# Patient Record
Sex: Female | Born: 1965
Health system: Southern US, Community
[De-identification: ages and names within clinical notes are randomized; demographics above are authoritative.]

## PROBLEM LIST (undated history)

## (undated) DIAGNOSIS — R05 Cough: Secondary | ICD-10-CM

## (undated) DIAGNOSIS — K219 Gastro-esophageal reflux disease without esophagitis: Secondary | ICD-10-CM

## (undated) DIAGNOSIS — E785 Hyperlipidemia, unspecified: Secondary | ICD-10-CM

## (undated) DIAGNOSIS — R059 Cough, unspecified: Secondary | ICD-10-CM

## (undated) DIAGNOSIS — R011 Cardiac murmur, unspecified: Secondary | ICD-10-CM

## (undated) HISTORY — DX: Gastro-esophageal reflux disease without esophagitis: K21.9

## (undated) HISTORY — DX: Cough: R05

## (undated) HISTORY — DX: Hyperlipidemia, unspecified: E78.5

## (undated) HISTORY — DX: Cough, unspecified: R05.9

## (undated) HISTORY — PX: NASAL SINUS SURGERY: SHX719

## (undated) HISTORY — DX: Cardiac murmur, unspecified: R01.1

---

## 2001-05-14 HISTORY — PX: BREAST LUMPECTOMY: SHX2

## 2004-07-07 ENCOUNTER — Ambulatory Visit (HOSPITAL_COMMUNITY): Admission: RE | Admit: 2004-07-07 | Discharge: 2004-07-07 | Payer: Self-pay | Admitting: Family Medicine

## 2004-07-18 ENCOUNTER — Encounter: Admission: RE | Admit: 2004-07-18 | Discharge: 2004-07-18 | Payer: Self-pay | Admitting: Family Medicine

## 2004-09-21 ENCOUNTER — Encounter: Admission: RE | Admit: 2004-09-21 | Discharge: 2004-09-21 | Payer: Self-pay | Admitting: Family Medicine

## 2005-06-08 ENCOUNTER — Ambulatory Visit (HOSPITAL_COMMUNITY): Admission: RE | Admit: 2005-06-08 | Discharge: 2005-06-08 | Payer: Self-pay | Admitting: Family Medicine

## 2005-06-08 ENCOUNTER — Encounter: Admission: RE | Admit: 2005-06-08 | Discharge: 2005-06-08 | Payer: Self-pay | Admitting: Family Medicine

## 2005-06-22 ENCOUNTER — Ambulatory Visit: Payer: Self-pay | Admitting: Internal Medicine

## 2005-07-03 ENCOUNTER — Ambulatory Visit: Payer: Self-pay | Admitting: Cardiovascular Disease

## 2006-06-11 ENCOUNTER — Encounter: Admission: RE | Admit: 2006-06-11 | Discharge: 2006-06-11 | Payer: Self-pay | Admitting: Family Medicine

## 2008-02-06 ENCOUNTER — Encounter: Admission: RE | Admit: 2008-02-06 | Discharge: 2008-02-06 | Payer: Self-pay | Admitting: Family Medicine

## 2008-02-10 ENCOUNTER — Encounter: Admission: RE | Admit: 2008-02-10 | Discharge: 2008-02-10 | Payer: Self-pay | Admitting: Family Medicine

## 2008-11-08 ENCOUNTER — Ambulatory Visit (HOSPITAL_COMMUNITY): Admission: RE | Admit: 2008-11-08 | Discharge: 2008-11-08 | Payer: Self-pay | Admitting: Specialist

## 2008-11-22 ENCOUNTER — Inpatient Hospital Stay (HOSPITAL_COMMUNITY): Admission: AD | Admit: 2008-11-22 | Discharge: 2008-11-22 | Payer: Self-pay | Admitting: Obstetrics & Gynecology

## 2009-01-08 ENCOUNTER — Inpatient Hospital Stay (HOSPITAL_COMMUNITY): Admission: AD | Admit: 2009-01-08 | Discharge: 2009-01-08 | Payer: Self-pay | Admitting: Obstetrics and Gynecology

## 2009-02-03 ENCOUNTER — Inpatient Hospital Stay (HOSPITAL_COMMUNITY): Admission: AD | Admit: 2009-02-03 | Discharge: 2009-03-26 | Payer: Self-pay | Admitting: Obstetrics and Gynecology

## 2009-02-07 ENCOUNTER — Encounter: Payer: Self-pay | Admitting: Obstetrics and Gynecology

## 2009-02-15 ENCOUNTER — Encounter: Payer: Self-pay | Admitting: Obstetrics and Gynecology

## 2009-03-14 ENCOUNTER — Encounter: Payer: Self-pay | Admitting: Obstetrics and Gynecology

## 2009-03-21 ENCOUNTER — Other Ambulatory Visit: Payer: Self-pay | Admitting: Obstetrics and Gynecology

## 2009-03-23 ENCOUNTER — Encounter (INDEPENDENT_AMBULATORY_CARE_PROVIDER_SITE_OTHER): Payer: Self-pay | Admitting: Obstetrics and Gynecology

## 2009-03-27 ENCOUNTER — Encounter: Admission: RE | Admit: 2009-03-27 | Discharge: 2009-04-25 | Payer: Self-pay | Admitting: Obstetrics and Gynecology

## 2009-04-26 ENCOUNTER — Encounter: Admission: RE | Admit: 2009-04-26 | Discharge: 2009-05-12 | Payer: Self-pay | Admitting: Obstetrics and Gynecology

## 2009-05-27 ENCOUNTER — Encounter: Admission: RE | Admit: 2009-05-27 | Discharge: 2009-06-26 | Payer: Self-pay | Admitting: Obstetrics and Gynecology

## 2009-06-27 ENCOUNTER — Encounter: Admission: RE | Admit: 2009-06-27 | Discharge: 2009-07-27 | Payer: Self-pay | Admitting: Obstetrics and Gynecology

## 2009-08-25 ENCOUNTER — Encounter: Admission: RE | Admit: 2009-08-25 | Discharge: 2009-09-24 | Payer: Self-pay | Admitting: Obstetrics and Gynecology

## 2009-09-17 ENCOUNTER — Emergency Department (HOSPITAL_COMMUNITY): Admission: EM | Admit: 2009-09-17 | Discharge: 2009-09-17 | Payer: Self-pay | Admitting: Emergency Medicine

## 2009-09-25 ENCOUNTER — Encounter: Admission: RE | Admit: 2009-09-25 | Discharge: 2009-10-25 | Payer: Self-pay | Admitting: Obstetrics and Gynecology

## 2009-09-25 ENCOUNTER — Ambulatory Visit (HOSPITAL_COMMUNITY): Admission: RE | Admit: 2009-09-25 | Discharge: 2009-09-25 | Payer: Self-pay | Admitting: Family Medicine

## 2009-10-26 ENCOUNTER — Encounter: Admission: RE | Admit: 2009-10-26 | Discharge: 2009-11-25 | Payer: Self-pay | Admitting: Obstetrics and Gynecology

## 2009-11-26 ENCOUNTER — Encounter: Admission: RE | Admit: 2009-11-26 | Discharge: 2009-12-26 | Payer: Self-pay | Admitting: Obstetrics and Gynecology

## 2009-12-27 ENCOUNTER — Encounter: Admission: RE | Admit: 2009-12-27 | Discharge: 2010-01-18 | Payer: Self-pay | Admitting: Obstetrics and Gynecology

## 2010-01-23 ENCOUNTER — Emergency Department (HOSPITAL_COMMUNITY): Admission: EM | Admit: 2010-01-23 | Discharge: 2010-01-23 | Payer: Self-pay | Admitting: Emergency Medicine

## 2010-06-04 ENCOUNTER — Encounter: Payer: Self-pay | Admitting: Family Medicine

## 2010-06-05 ENCOUNTER — Encounter: Payer: Self-pay | Admitting: Obstetrics and Gynecology

## 2010-06-05 ENCOUNTER — Encounter: Payer: Self-pay | Admitting: Family Medicine

## 2010-08-16 LAB — URINALYSIS, ROUTINE W REFLEX MICROSCOPIC
Glucose, UA: NEGATIVE mg/dL
pH: 7 (ref 5.0–8.0)

## 2010-08-16 LAB — CBC
HCT: 33.2 % — ABNORMAL LOW (ref 36.0–46.0)
Hemoglobin: 11.1 g/dL — ABNORMAL LOW (ref 12.0–15.0)
MCV: 90.1 fL (ref 78.0–100.0)
Platelets: 238 10*3/uL (ref 150–400)
Platelets: 254 10*3/uL (ref 150–400)
RBC: 3.74 MIL/uL — ABNORMAL LOW (ref 3.87–5.11)
RDW: 15.7 % — ABNORMAL HIGH (ref 11.5–15.5)
RDW: 15.9 % — ABNORMAL HIGH (ref 11.5–15.5)
WBC: 11.8 10*3/uL — ABNORMAL HIGH (ref 4.0–10.5)
WBC: 12.9 10*3/uL — ABNORMAL HIGH (ref 4.0–10.5)

## 2010-08-16 LAB — GLUCOSE, CAPILLARY
Glucose-Capillary: 104 mg/dL — ABNORMAL HIGH (ref 70–99)
Glucose-Capillary: 109 mg/dL — ABNORMAL HIGH (ref 70–99)
Glucose-Capillary: 110 mg/dL — ABNORMAL HIGH (ref 70–99)
Glucose-Capillary: 111 mg/dL — ABNORMAL HIGH (ref 70–99)
Glucose-Capillary: 115 mg/dL — ABNORMAL HIGH (ref 70–99)
Glucose-Capillary: 131 mg/dL — ABNORMAL HIGH (ref 70–99)
Glucose-Capillary: 131 mg/dL — ABNORMAL HIGH (ref 70–99)
Glucose-Capillary: 133 mg/dL — ABNORMAL HIGH (ref 70–99)
Glucose-Capillary: 169 mg/dL — ABNORMAL HIGH (ref 70–99)
Glucose-Capillary: 69 mg/dL — ABNORMAL LOW (ref 70–99)
Glucose-Capillary: 75 mg/dL (ref 70–99)
Glucose-Capillary: 83 mg/dL (ref 70–99)
Glucose-Capillary: 86 mg/dL (ref 70–99)
Glucose-Capillary: 87 mg/dL (ref 70–99)
Glucose-Capillary: 92 mg/dL (ref 70–99)
Glucose-Capillary: 95 mg/dL (ref 70–99)
Glucose-Capillary: 96 mg/dL (ref 70–99)
Glucose-Capillary: 99 mg/dL (ref 70–99)

## 2010-08-16 LAB — URINE MICROSCOPIC-ADD ON

## 2010-08-16 LAB — MAGNESIUM
Magnesium: 5.1 mg/dL — ABNORMAL HIGH (ref 1.5–2.5)
Magnesium: 6.1 mg/dL (ref 1.5–2.5)

## 2010-08-16 LAB — URINE CULTURE
Colony Count: 75000
Culture: NO GROWTH

## 2010-08-16 LAB — STREP B DNA PROBE: Strep Group B Ag: NEGATIVE

## 2010-08-17 LAB — GLUCOSE, CAPILLARY
Glucose-Capillary: 100 mg/dL — ABNORMAL HIGH (ref 70–99)
Glucose-Capillary: 101 mg/dL — ABNORMAL HIGH (ref 70–99)
Glucose-Capillary: 101 mg/dL — ABNORMAL HIGH (ref 70–99)
Glucose-Capillary: 101 mg/dL — ABNORMAL HIGH (ref 70–99)
Glucose-Capillary: 101 mg/dL — ABNORMAL HIGH (ref 70–99)
Glucose-Capillary: 107 mg/dL — ABNORMAL HIGH (ref 70–99)
Glucose-Capillary: 109 mg/dL — ABNORMAL HIGH (ref 70–99)
Glucose-Capillary: 111 mg/dL — ABNORMAL HIGH (ref 70–99)
Glucose-Capillary: 111 mg/dL — ABNORMAL HIGH (ref 70–99)
Glucose-Capillary: 116 mg/dL — ABNORMAL HIGH (ref 70–99)
Glucose-Capillary: 120 mg/dL — ABNORMAL HIGH (ref 70–99)
Glucose-Capillary: 120 mg/dL — ABNORMAL HIGH (ref 70–99)
Glucose-Capillary: 122 mg/dL — ABNORMAL HIGH (ref 70–99)
Glucose-Capillary: 123 mg/dL — ABNORMAL HIGH (ref 70–99)
Glucose-Capillary: 124 mg/dL — ABNORMAL HIGH (ref 70–99)
Glucose-Capillary: 138 mg/dL — ABNORMAL HIGH (ref 70–99)
Glucose-Capillary: 145 mg/dL — ABNORMAL HIGH (ref 70–99)
Glucose-Capillary: 62 mg/dL — ABNORMAL LOW (ref 70–99)
Glucose-Capillary: 82 mg/dL (ref 70–99)
Glucose-Capillary: 83 mg/dL (ref 70–99)
Glucose-Capillary: 87 mg/dL (ref 70–99)
Glucose-Capillary: 88 mg/dL (ref 70–99)
Glucose-Capillary: 88 mg/dL (ref 70–99)
Glucose-Capillary: 89 mg/dL (ref 70–99)
Glucose-Capillary: 89 mg/dL (ref 70–99)
Glucose-Capillary: 90 mg/dL (ref 70–99)
Glucose-Capillary: 91 mg/dL (ref 70–99)
Glucose-Capillary: 92 mg/dL (ref 70–99)
Glucose-Capillary: 93 mg/dL (ref 70–99)
Glucose-Capillary: 94 mg/dL (ref 70–99)
Glucose-Capillary: 94 mg/dL (ref 70–99)
Glucose-Capillary: 99 mg/dL (ref 70–99)
Glucose-Capillary: 99 mg/dL (ref 70–99)
Glucose-Capillary: 100 mg/dL — ABNORMAL HIGH (ref 70–99)
Glucose-Capillary: 101 mg/dL — ABNORMAL HIGH (ref 70–99)
Glucose-Capillary: 101 mg/dL — ABNORMAL HIGH (ref 70–99)
Glucose-Capillary: 101 mg/dL — ABNORMAL HIGH (ref 70–99)
Glucose-Capillary: 102 mg/dL — ABNORMAL HIGH (ref 70–99)
Glucose-Capillary: 103 mg/dL — ABNORMAL HIGH (ref 70–99)
Glucose-Capillary: 103 mg/dL — ABNORMAL HIGH (ref 70–99)
Glucose-Capillary: 104 mg/dL — ABNORMAL HIGH (ref 70–99)
Glucose-Capillary: 104 mg/dL — ABNORMAL HIGH (ref 70–99)
Glucose-Capillary: 108 mg/dL — ABNORMAL HIGH (ref 70–99)
Glucose-Capillary: 108 mg/dL — ABNORMAL HIGH (ref 70–99)
Glucose-Capillary: 108 mg/dL — ABNORMAL HIGH (ref 70–99)
Glucose-Capillary: 111 mg/dL — ABNORMAL HIGH (ref 70–99)
Glucose-Capillary: 111 mg/dL — ABNORMAL HIGH (ref 70–99)
Glucose-Capillary: 111 mg/dL — ABNORMAL HIGH (ref 70–99)
Glucose-Capillary: 118 mg/dL — ABNORMAL HIGH (ref 70–99)
Glucose-Capillary: 121 mg/dL — ABNORMAL HIGH (ref 70–99)
Glucose-Capillary: 124 mg/dL — ABNORMAL HIGH (ref 70–99)
Glucose-Capillary: 126 mg/dL — ABNORMAL HIGH (ref 70–99)
Glucose-Capillary: 77 mg/dL (ref 70–99)
Glucose-Capillary: 81 mg/dL (ref 70–99)
Glucose-Capillary: 83 mg/dL (ref 70–99)
Glucose-Capillary: 87 mg/dL (ref 70–99)
Glucose-Capillary: 89 mg/dL (ref 70–99)
Glucose-Capillary: 90 mg/dL (ref 70–99)
Glucose-Capillary: 93 mg/dL (ref 70–99)
Glucose-Capillary: 93 mg/dL (ref 70–99)
Glucose-Capillary: 95 mg/dL (ref 70–99)
Glucose-Capillary: 96 mg/dL (ref 70–99)
Glucose-Capillary: 97 mg/dL (ref 70–99)
Glucose-Capillary: 98 mg/dL (ref 70–99)
Glucose-Capillary: 98 mg/dL (ref 70–99)
Glucose-Capillary: 98 mg/dL (ref 70–99)
Glucose-Capillary: 99 mg/dL (ref 70–99)

## 2010-08-17 LAB — URINE CULTURE
Colony Count: 10000
Special Requests: NEGATIVE

## 2010-08-17 LAB — URINALYSIS, ROUTINE W REFLEX MICROSCOPIC
Bilirubin Urine: NEGATIVE
Glucose, UA: NEGATIVE mg/dL
Hgb urine dipstick: NEGATIVE
Protein, ur: NEGATIVE mg/dL
Specific Gravity, Urine: 1.015 (ref 1.005–1.030)
Urobilinogen, UA: 0.2 mg/dL (ref 0.0–1.0)

## 2010-08-17 LAB — URINALYSIS, MICROSCOPIC ONLY
Bilirubin Urine: NEGATIVE
Hgb urine dipstick: NEGATIVE
Ketones, ur: NEGATIVE mg/dL
Specific Gravity, Urine: >1.03 — ABNORMAL HIGH (ref 1.005–1.030)
pH: 6 (ref 5.0–8.0)

## 2010-08-18 LAB — CBC
HCT: 32.4 % — ABNORMAL LOW (ref 36.0–46.0)
MCV: 86.6 fL (ref 78.0–100.0)
Platelets: 266 10*3/uL (ref 150–400)
RDW: 14.6 % (ref 11.5–15.5)

## 2010-08-18 LAB — GLUCOSE, CAPILLARY
Glucose-Capillary: 101 mg/dL — ABNORMAL HIGH (ref 70–99)
Glucose-Capillary: 105 mg/dL — ABNORMAL HIGH (ref 70–99)
Glucose-Capillary: 106 mg/dL — ABNORMAL HIGH (ref 70–99)
Glucose-Capillary: 107 mg/dL — ABNORMAL HIGH (ref 70–99)
Glucose-Capillary: 109 mg/dL — ABNORMAL HIGH (ref 70–99)
Glucose-Capillary: 143 mg/dL — ABNORMAL HIGH (ref 70–99)
Glucose-Capillary: 78 mg/dL (ref 70–99)
Glucose-Capillary: 80 mg/dL (ref 70–99)
Glucose-Capillary: 81 mg/dL (ref 70–99)
Glucose-Capillary: 86 mg/dL (ref 70–99)
Glucose-Capillary: 88 mg/dL (ref 70–99)
Glucose-Capillary: 91 mg/dL (ref 70–99)
Glucose-Capillary: 93 mg/dL (ref 70–99)
Glucose-Capillary: 95 mg/dL (ref 70–99)
Glucose-Capillary: 97 mg/dL (ref 70–99)
Glucose-Capillary: 99 mg/dL (ref 70–99)

## 2010-08-18 LAB — URINE CULTURE

## 2010-08-18 LAB — DIFFERENTIAL
Basophils Absolute: 0.1 10*3/uL (ref 0.0–0.1)
Eosinophils Absolute: 0.1 10*3/uL (ref 0.0–0.7)
Eosinophils Relative: 1 % (ref 0–5)
Lymphocytes Relative: 20 % (ref 12–46)
Neutrophils Relative %: 69 % (ref 43–77)

## 2010-08-18 LAB — URINALYSIS, ROUTINE W REFLEX MICROSCOPIC
Ketones, ur: NEGATIVE mg/dL
Nitrite: NEGATIVE
Protein, ur: NEGATIVE mg/dL

## 2010-08-19 LAB — ANTIPHOSPHOLIPID SYNDROME EVAL, BLD
Anticardiolipin IgA: <10 APL U/mL — ABNORMAL LOW (ref <10)
Anticardiolipin IgG: <10 GPL U/mL — ABNORMAL LOW (ref <10)
Anticardiolipin IgM: <10 MPL U/mL — ABNORMAL LOW (ref <10)
Antiphosphatidylserine IgA: <20 APS U/mL (ref <20.0)
Antiphosphatidylserine IgG: <11 GPS U/mL (ref <11.0)
Antiphosphatidylserine IgM: <25 MPS U/mL (ref <25.0)
PTT Lupus Anticoagulant: 39.3 secs (ref 32.0–43.4)

## 2010-12-01 ENCOUNTER — Inpatient Hospital Stay (INDEPENDENT_AMBULATORY_CARE_PROVIDER_SITE_OTHER)
Admission: RE | Admit: 2010-12-01 | Discharge: 2010-12-01 | Disposition: A | Discharge status: Home / Self Care | Payer: 59 | Source: Ambulatory Visit | Attending: Emergency Medicine | Admitting: Emergency Medicine

## 2010-12-01 DIAGNOSIS — S61209A Unspecified open wound of unspecified finger without damage to nail, initial encounter: Secondary | ICD-10-CM

## 2010-12-13 IMAGING — US US OB TRANSVAGINAL
1 series · 12 of 12 positions shown · non-contrast
Comparison: none

OBSTETRICAL ULTRASOUND:
 This ultrasound exam was performed in the [HOSPITAL] Ultrasound Department.  The OB US report was generated in the AS system, and faxed to the ordering physician.  This report is also available in [HOSPITAL]?s accessANYware and in [REDACTED] PACS.

[Series 1: us ob transvaginal · 0.12mm/px · 12 acquisitions, 12 frames shown]
[im 1/12]
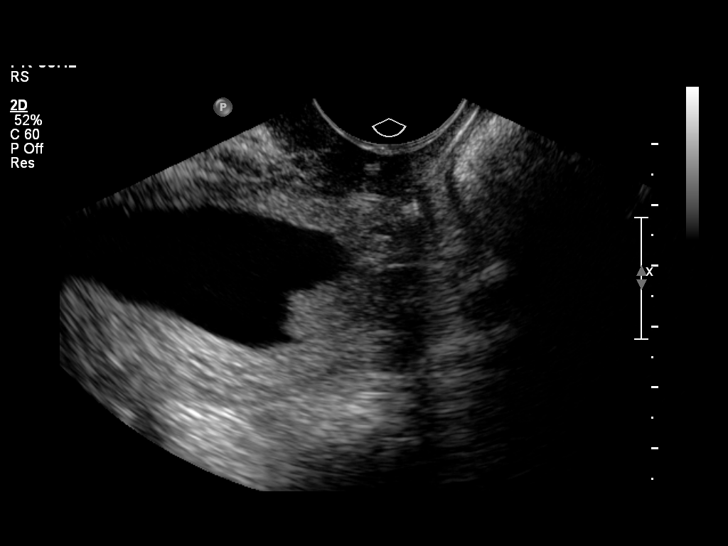
[im 2/12]
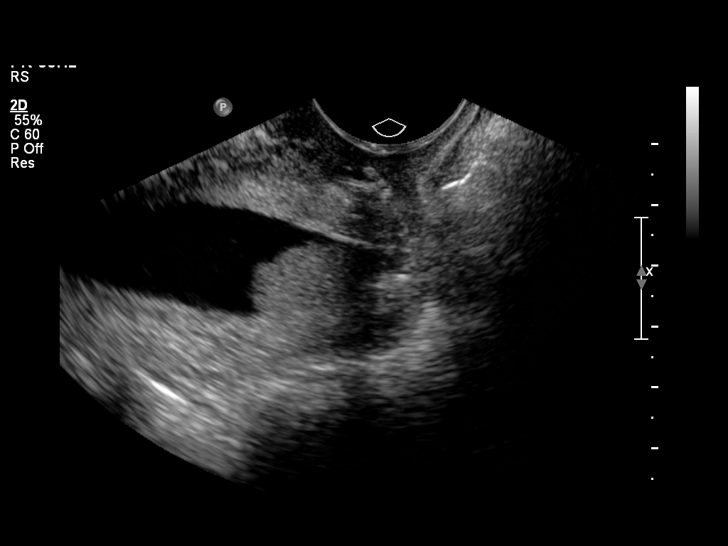
[im 3/12]
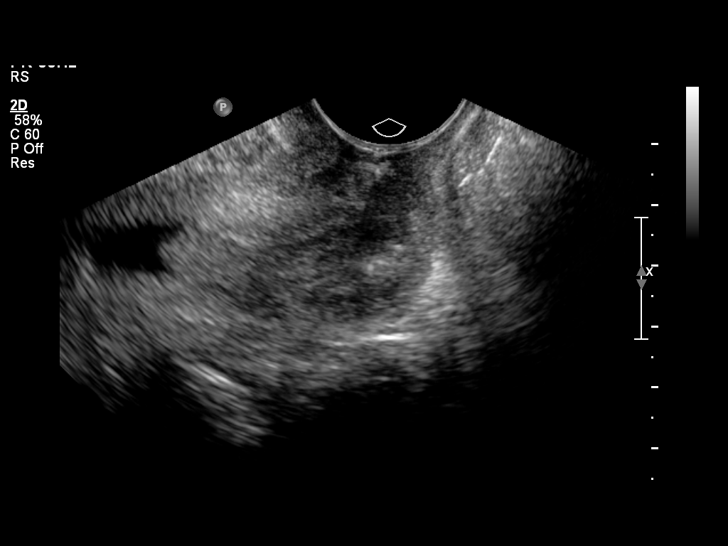
[im 4/12]
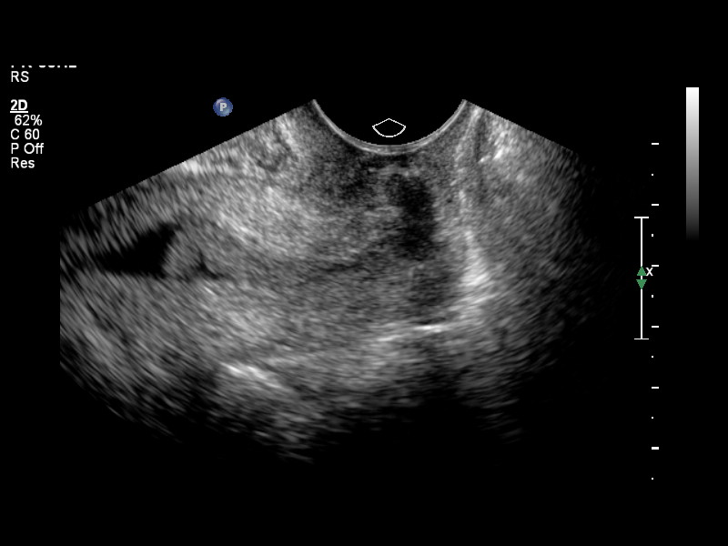
[im 5/12]
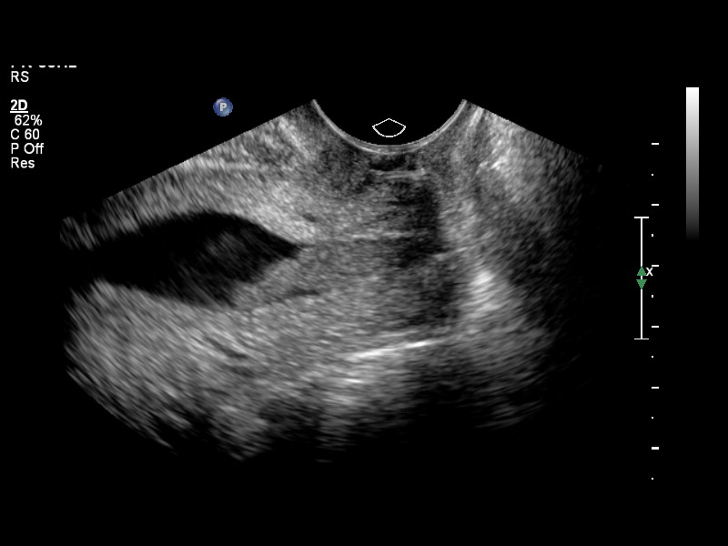
[im 6/12]
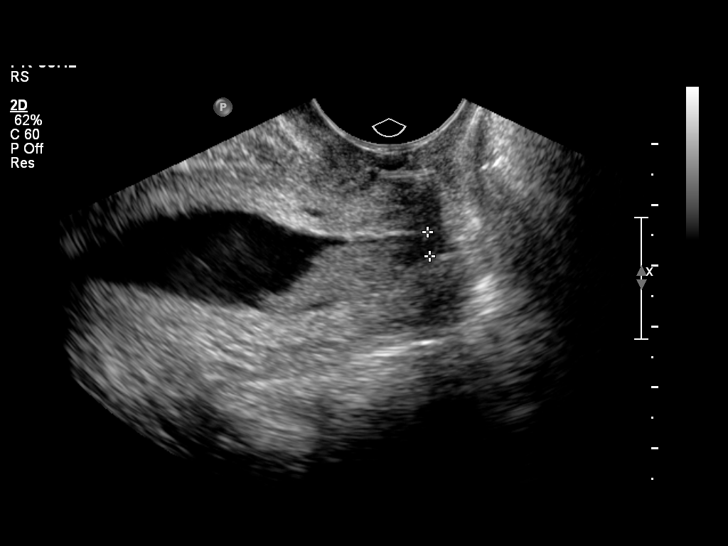
[im 7/12]
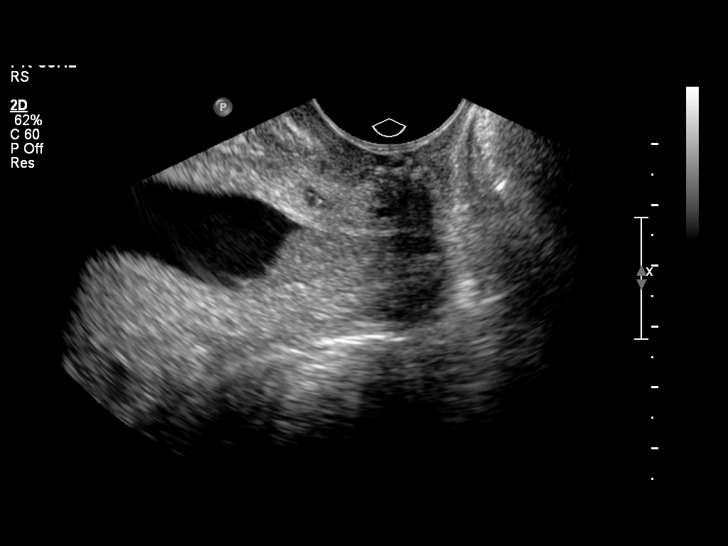
[im 8/12]
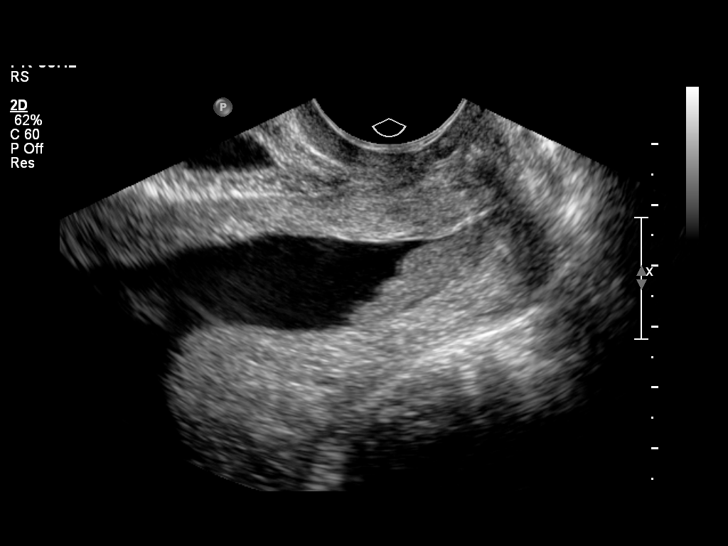
[im 9/12]
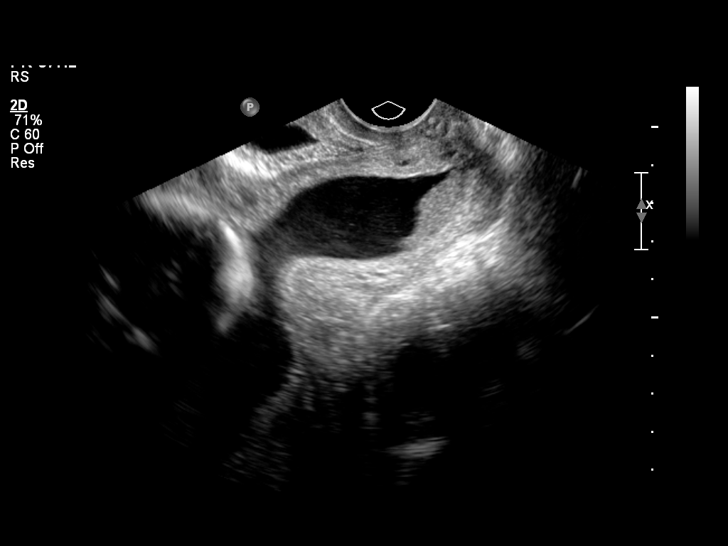
[im 10/12]
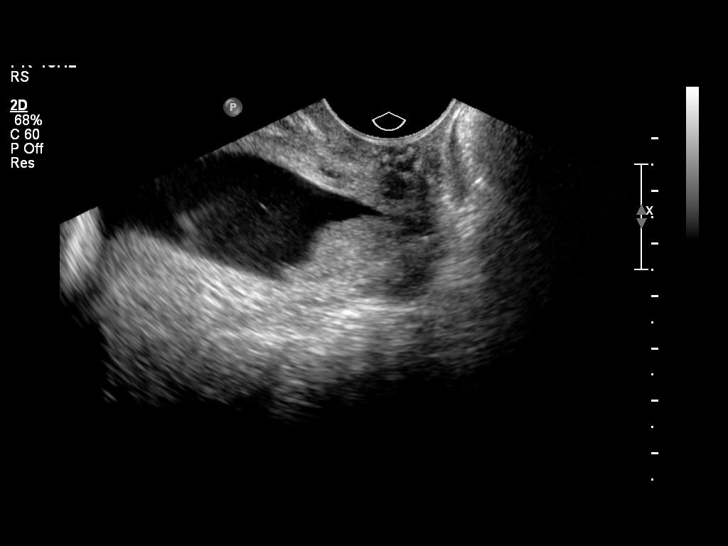
[im 11/12]
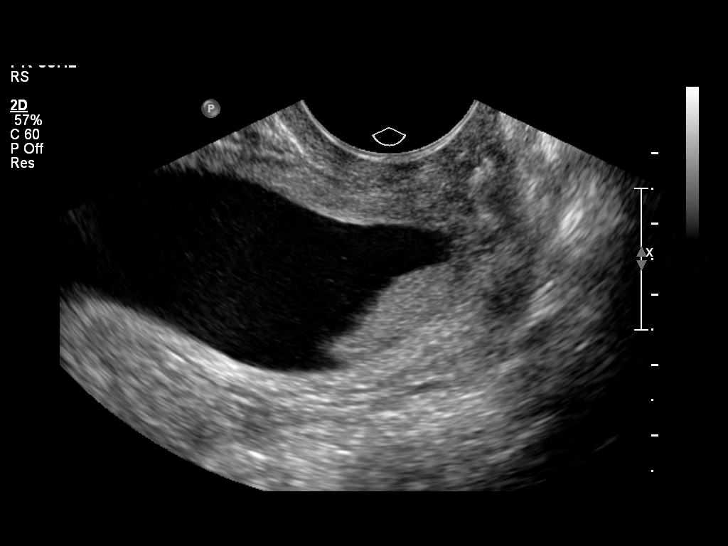
[im 12/12]
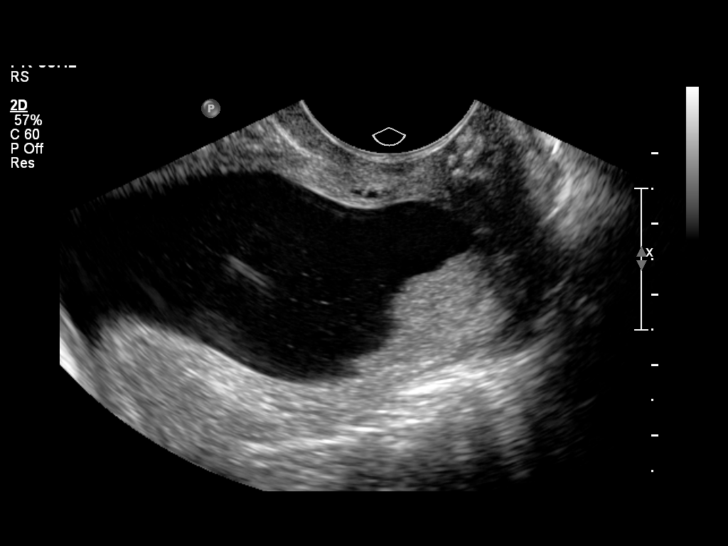

[12 of 12 positions shown; findings below may reference images not displayed]

IMPRESSION: See AS Obstetric US report.

## 2011-01-09 IMAGING — US US OB DETAIL EACH ADDL GEST + 14 WK
1 series · 14 of 28 positions shown · non-contrast
Comparison: none

OBSTETRICAL ULTRASOUND:
 This ultrasound was performed in The [HOSPITAL], and the AS OB/GYN report will be stored to [REDACTED] PACS.  This report is also available in [HOSPITAL]?s accessANYware.

[Series 1: us ob detail each addl gest + 14 wk · 0.30mm/px · 14 of 136 slices shown]
[im 6/136]
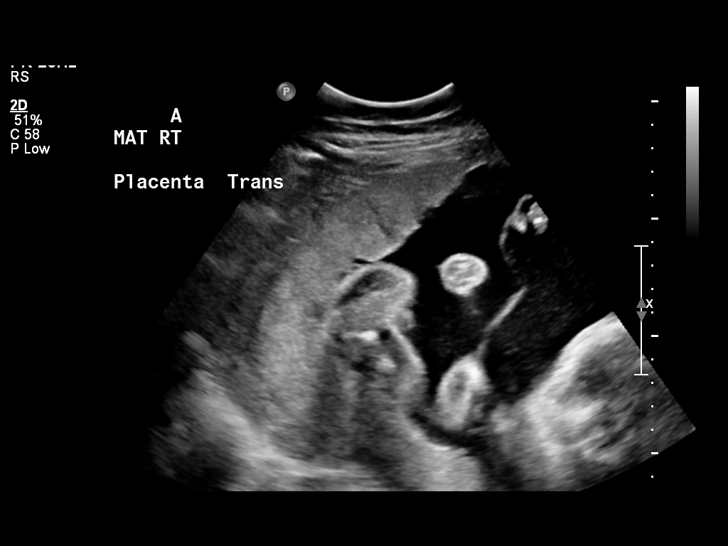
[im 16/136]
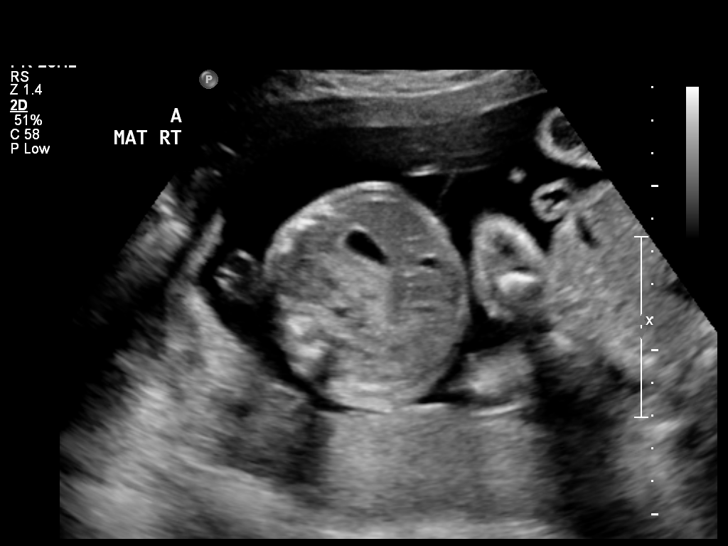
[im 26/136]
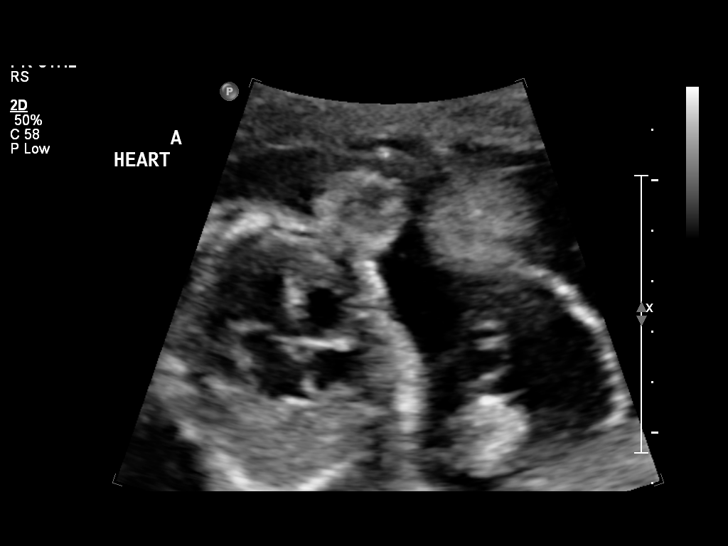
[im 36/136]
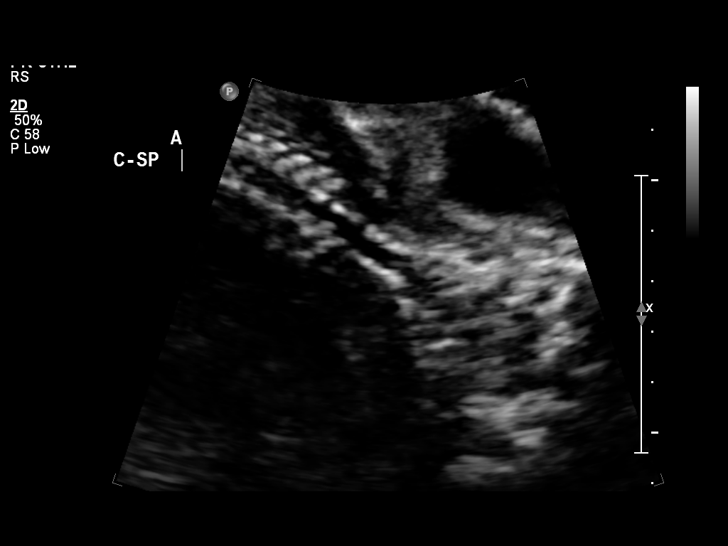
[im 46/136]
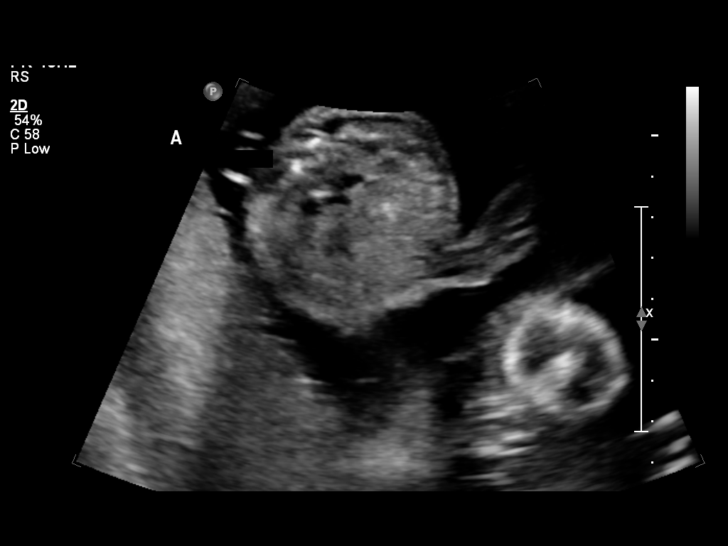
[im 56/136]
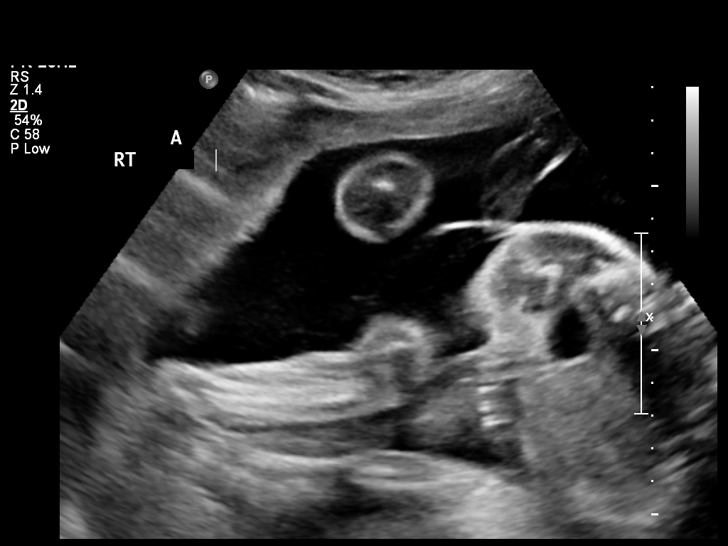
[im 66/136]
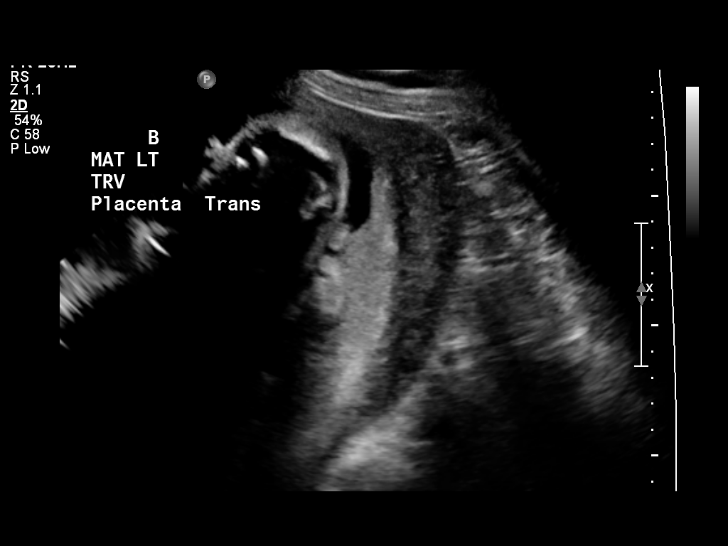
[im 76/136]
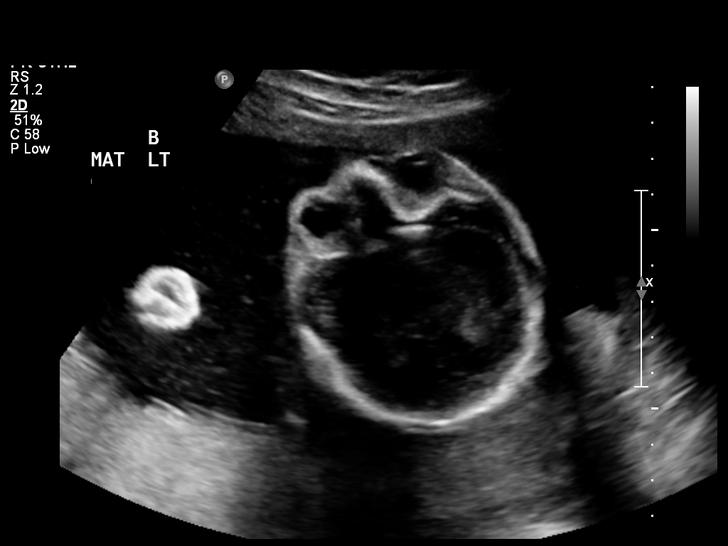
[im 86/136]
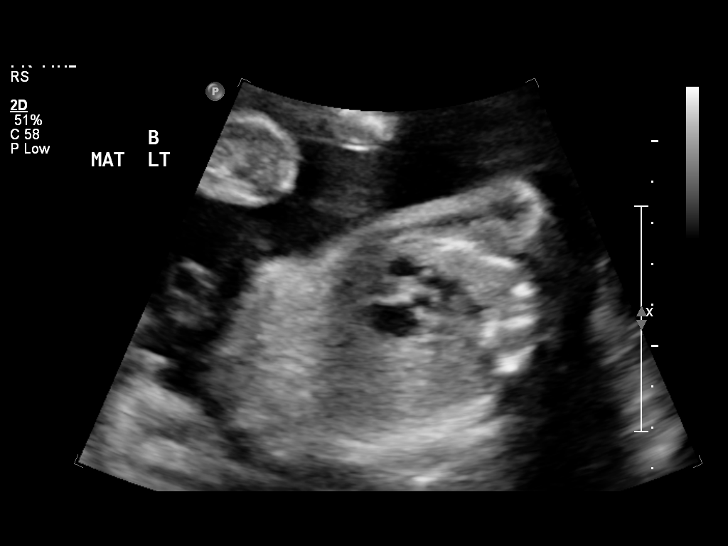
[im 96/136]
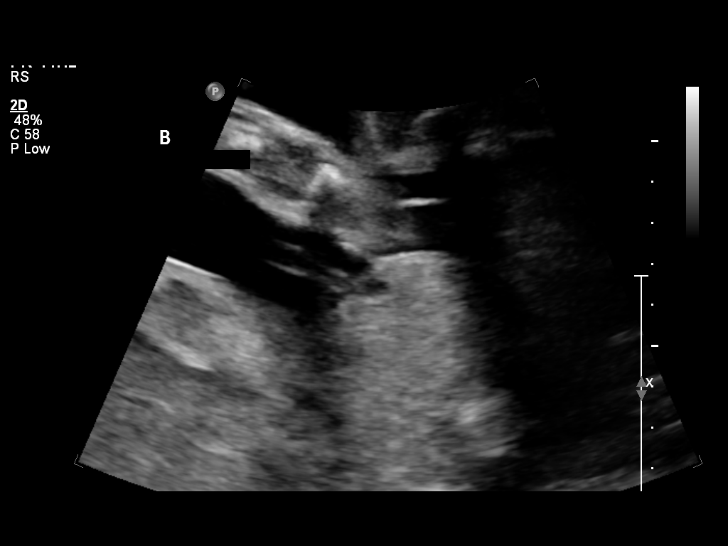
[im 106/136]
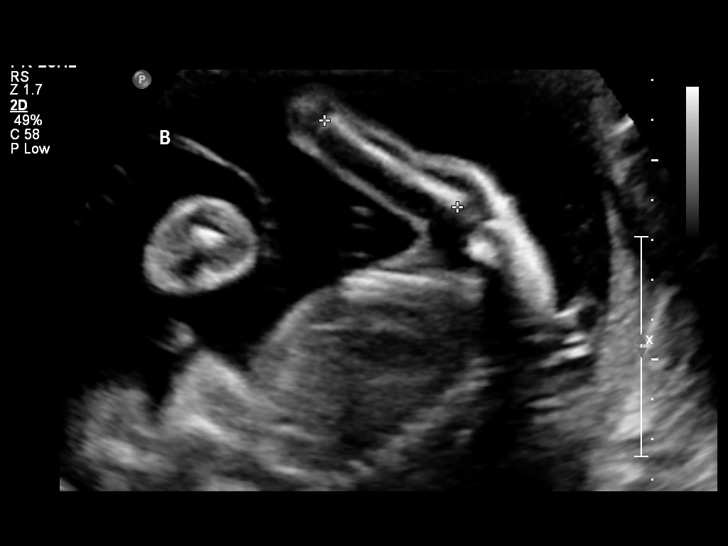
[im 116/136]
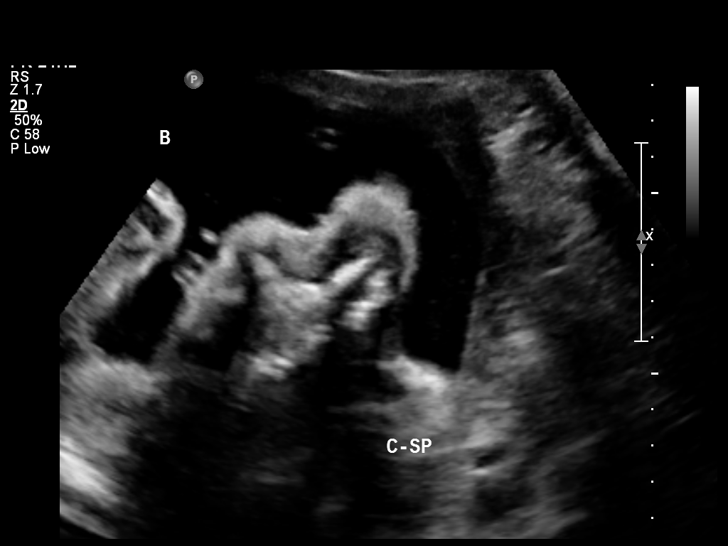
[im 126/136]
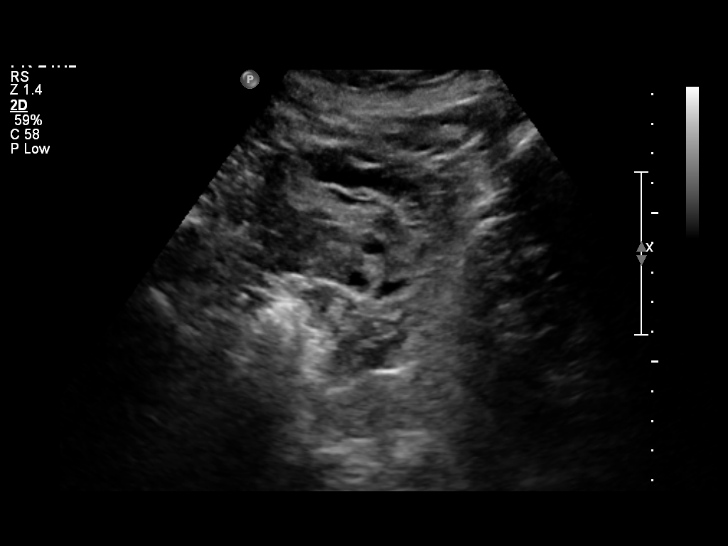
[im 136/136]
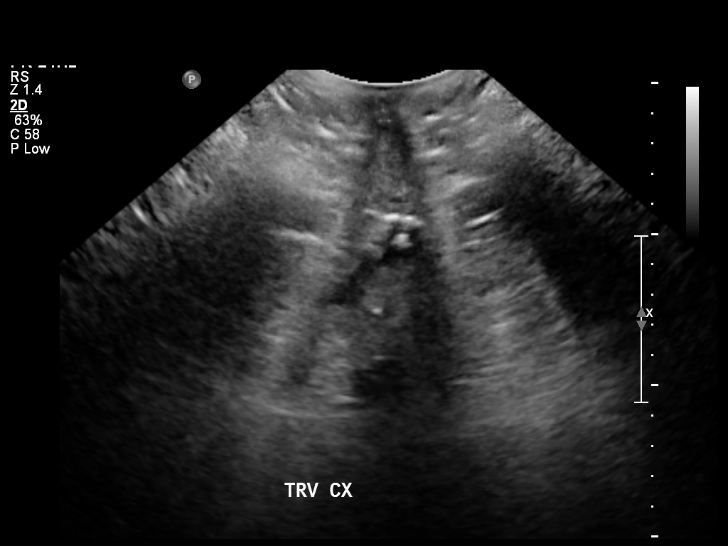

[14 of 28 positions shown; findings below may reference images not displayed]

IMPRESSION: AS OB/GYN has also been faxed to the ordering physician.

## 2011-02-01 ENCOUNTER — Inpatient Hospital Stay (INDEPENDENT_AMBULATORY_CARE_PROVIDER_SITE_OTHER)
Admission: RE | Admit: 2011-02-01 | Discharge: 2011-02-01 | Disposition: A | Discharge status: Home / Self Care | Payer: 59 | Source: Ambulatory Visit | Attending: Family Medicine | Admitting: Family Medicine

## 2011-02-01 DIAGNOSIS — R05 Cough: Secondary | ICD-10-CM

## 2011-02-01 DIAGNOSIS — J019 Acute sinusitis, unspecified: Secondary | ICD-10-CM

## 2011-03-02 ENCOUNTER — Inpatient Hospital Stay (INDEPENDENT_AMBULATORY_CARE_PROVIDER_SITE_OTHER)
Admission: RE | Admit: 2011-03-02 | Discharge: 2011-03-02 | Disposition: A | Discharge status: Home / Self Care | Payer: 59 | Source: Ambulatory Visit | Attending: Emergency Medicine | Admitting: Emergency Medicine

## 2011-03-02 ENCOUNTER — Ambulatory Visit (INDEPENDENT_AMBULATORY_CARE_PROVIDER_SITE_OTHER): Payer: 59

## 2011-03-02 DIAGNOSIS — J019 Acute sinusitis, unspecified: Secondary | ICD-10-CM

## 2011-03-02 DIAGNOSIS — J40 Bronchitis, not specified as acute or chronic: Secondary | ICD-10-CM

## 2011-04-03 ENCOUNTER — Ambulatory Visit (INDEPENDENT_AMBULATORY_CARE_PROVIDER_SITE_OTHER): Payer: 59 | Admitting: Internal Medicine

## 2011-04-03 ENCOUNTER — Telehealth: Payer: Self-pay | Admitting: Internal Medicine

## 2011-04-03 ENCOUNTER — Encounter: Payer: Self-pay | Admitting: Internal Medicine

## 2011-04-03 VITALS — BP 116/72 | HR 67 | Temp 97.8°F | Ht 60.0 in | Wt 192.0 lb

## 2011-04-03 DIAGNOSIS — R05 Cough: Secondary | ICD-10-CM

## 2011-04-03 MED ORDER — FLUTICASONE PROPIONATE 50 MCG/ACT NA SUSP: 2.0000 | Freq: Every day | NASAL | Status: DC

## 2011-04-03 MED ORDER — BECLOMETHASONE DIPROPIONATE 80 MCG/ACT IN AERS: 2.0000 | INHALATION_SPRAY | Freq: Two times a day (BID) | RESPIRATORY_TRACT | Status: DC

## 2011-04-03 MED ORDER — OMEPRAZOLE-SODIUM BICARBONATE 20-1100 MG PO CAPS: ORAL_CAPSULE | ORAL | Status: DC

## 2011-04-03 MED ORDER — CHLORPHENIRAMINE MALEATE 4 MG PO TABS: ORAL_TABLET | ORAL | Status: DC

## 2011-04-03 NOTE — Telephone Encounter (Signed)
Jen,  Please tell her to stop fish oil; forgot to tell her that  Thanks  MR

## 2011-04-03 NOTE — Patient Instructions (Signed)
Cough is from sinus drainage, acid reflux, asthma, and vocal cord dysfunction All of this is working together to cause cyclical cough/LPR cough  #Sinus drainage  - start/continue netti pot daily (nurse will show you picture) - start antihistamine chlorpheniramine 4mg  tablet 1-2 at night; if this makes you too sleepy or dry cut down (nurse will do script) - start nasal steroid generic fluticasone inhaler 2 squirts each nostril daily as advised (nurse will do script)  #Possible Acid Reflux  - take otc zegerid 20mg   1 capsule daily on empty stomach   - take diet sheet from [place] - avoid colas, spices, cheeses, spirits, red meats, beer, chocolates, fried foods etc.,   - sleep with head end of bed elevated  - eat small frequent meals  - do not go to bed for 3 hours after last meal  #Possible Asthma   - start QVAR 2 puff twice daily - take samples and script  and show inhaler technique  #Cyclical cough  - please choose 2-3 days and observe complete voice rest - no talking or whispering  - at all times there  there is urge to cough, drink water or swallow or sip on throat lozenge  #Followup - I will see you in 4-6 weeks.  - any problems call or come sooner

## 2011-04-03 NOTE — Progress Notes (Signed)
Subjective:    Patient ID: Paula Sparks, female    DOB: [birth year], 45 y.o.   MRN: [id number]  HPI 3 year african female from [place]. Moved from [facility] to [place] in 2006. 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  Reports cough x 2-3 months. Initially on amoxicilin and did not help. CXR ok. Then took augmentin, tussionex and prednisone but did not go away. Insidious onset but says a patient coughed in her face and few days later she started coughing. Slowly progressive. Moderate-severe in intensity. Day and night. Has yellowish-greenish thick sputum small amounts all along although initially greenish. Feels constant feeling of mucus feeeling in nose, throat and chest. Associated feeling of tickle in throat +. Denies GERD but is on fish oil for years. Denies hypertension or ACE inhibitor intake. Denies past hx of asthma but says husband notices her to wheeze sometimes at night.  Denis dyspnea or chest tightness but chest feels severly congested. Pulmicort was advised by PMD but she never took it.   RSI cough score 28 - level 5 for hoarseness, clearing of throat, and post nasal drop, and annoying cough and lump in throat. Level 1 for difficulty swalling, cughing after lying down, choking episodes\\  Of note saw Dr Sherene Sires in 2007 for cough - sinus etiology considered at that time per his notes. CT 07/03/05 showed thickening of mucosa but no sinusitis  Past Medical History  Diagnosis Date  . Hyperlipidemia   . Cough   . Murmur, heart      No family history on file.   History   Social History  . Marital Status: Married    Spouse Name: N/A    Number of Children: N/A  . Years of Education: N/A   Occupational History  . RN [employer]   Social History Main Topics  . Smoking status: Never Smoker   . Smokeless tobacco: Not on file  . Alcohol Use: No  . Drug Use: No  . Sexually Active: Not on file   Other Topics Concern  . Not on file   Social History Narrative  . No narrative on file     No  Known Allergies   No outpatient prescriptions prior to visit.      Review of Systems  Constitutional: Negative for fever and unexpected weight change.  HENT: Positive for congestion and sore throat. Negative for ear pain, nosebleeds, rhinorrhea, sneezing, trouble swallowing, dental problem, postnasal drip and sinus pressure.   Eyes: Negative for redness and itching.  Respiratory: Positive for cough. Negative for chest tightness, shortness of breath and wheezing.   Cardiovascular: Positive for leg swelling. Negative for palpitations.  Gastrointestinal: Negative for nausea and vomiting.  Genitourinary: Negative for dysuria.  Musculoskeletal: Negative for joint swelling.  Skin: Negative for rash.  Neurological: Positive for headaches.  Hematological: Does not bruise/bleed easily.  Psychiatric/Behavioral: Negative for dysphoric mood. The patient is not nervous/anxious.        Objective:   Physical Exam  Vitals reviewed. Constitutional: She is oriented to person, place, and time. She appears well-developed and well-nourished. No distress.       obese  HENT:  Head: Normocephalic and atraumatic.  Right Ear: External ear normal.  Left Ear: External ear normal.  Mouth/Throat: Oropharynx is clear and moist. No oropharyngeal exudate.  Eyes: Conjunctivae and EOM are normal. Pupils are equal, round, and reactive to light. Right eye exhibits no discharge. Left eye exhibits no discharge. No scleral icterus.  Neck: Normal range of motion. Neck supple.  No JVD present. No tracheal deviation present. No thyromegaly present.       Small white fleck on left tonsil - says saw ENT a year ago and cleared  Cardiovascular: Normal rate, regular rhythm, normal heart sounds and intact distal pulses.  Exam reveals no gallop and no friction rub.   No murmur heard. Pulmonary/Chest: Effort normal and breath sounds normal. No respiratory distress. She has no wheezes. She has no rales. She exhibits no tenderness.   Abdominal: Soft. Bowel sounds are normal. She exhibits no distension and no mass. There is no tenderness. There is no rebound and no guarding.  Musculoskeletal: Normal range of motion. She exhibits no edema and no tenderness.  Lymphadenopathy:    She has no cervical adenopathy.  Neurological: She is alert and oriented to person, place, and time. She has normal reflexes. No cranial nerve deficit. She exhibits normal muscle tone. Coordination normal.  Skin: Skin is warm and dry. No rash noted. She is not diaphoretic. No erythema. No pallor.  Psychiatric: She has a normal mood and affect. Her behavior is normal. Judgment and thought content normal.          Assessment & Plan:

## 2011-04-03 NOTE — Assessment & Plan Note (Addendum)
Cough is from sinus drainage, acid reflux, asthma, and vocal cord dysfunction All of this is working together to cause cyclical cough/LPR cough  #Sinus drainage  - start/continue netti pot daily (nurse will show you picture) - start antihistamine chlorpheniramine 4mg  tablet 1-2 at night; if this makes you too sleepy or dry cut down (nurse will do script) - start nasal steroid generic fluticasone inhaler 2 squirts each nostril daily as advised (nurse will do script)  #Possible Acid Reflux  - take otc zegerid 20mg   1 capsule daily on empty stomach   - take diet sheet from [place] - avoid colas, spices, cheeses, spirits, red meats, beer, chocolates, fried foods etc.,   - sleep with head end of bed elevated  - eat small frequent meals  - do not go to bed for 3 hours after last meal - stop fish oil  #Possible Asthma   - start QVAR 2 puff twice daily - take samples and script  and show inhaler technique  #Cyclical cough  - please choose 2-3 days and observe complete voice rest - no talking or whispering  - at all times there  there is urge to cough, drink water or swallow or sip on throat lozenge  #Followup - I will see you in 4-6 weeks.  - any problems call or come sooner

## 2011-04-04 NOTE — Telephone Encounter (Signed)
Pt advised to stop fish oil.Carron Curie, CMA

## 2011-05-01 ENCOUNTER — Ambulatory Visit: Payer: 59 | Admitting: Internal Medicine

## 2011-05-03 ENCOUNTER — Ambulatory Visit (INDEPENDENT_AMBULATORY_CARE_PROVIDER_SITE_OTHER): Payer: 59 | Admitting: Internal Medicine

## 2011-05-03 ENCOUNTER — Encounter: Payer: Self-pay | Admitting: Internal Medicine

## 2011-05-03 VITALS — BP 110/64 | HR 63 | Temp 98.2°F | Ht 60.0 in | Wt 192.4 lb

## 2011-05-03 DIAGNOSIS — R059 Cough, unspecified: Secondary | ICD-10-CM

## 2011-05-03 DIAGNOSIS — J3489 Other specified disorders of nose and nasal sinuses: Secondary | ICD-10-CM

## 2011-05-03 DIAGNOSIS — R011 Cardiac murmur, unspecified: Secondary | ICD-10-CM

## 2011-05-03 DIAGNOSIS — R0600 Dyspnea, unspecified: Secondary | ICD-10-CM

## 2011-05-03 DIAGNOSIS — R05 Cough: Secondary | ICD-10-CM

## 2011-05-03 NOTE — Progress Notes (Signed)
Subjective:    Patient ID: Paula Sparks, female    DOB: 1965-10-30, 45 y.o.   MRN: [id number]  HPI  19 year african female from [place]. 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  Reports cough x 2-3 months. Initially on amoxicilin and did not help. CXR ok. Then took augmentin, tussionex and prednisone but did not go away. Insidious onset but says a patient coughed in her face and few days later she started coughing. Slowly progressive. Moderate-severe in intensity. Day and night. Has yellowish-greenish thick sputum small amounts all along although initially greenish. Feels constant feeling of mucus feeeling in nose, throat and chest. Associated feeling of tickle in throat +. Denies GERD but is on fish oil for years. Denies hypertension or ACE inhibitor intake. Denies past hx of asthma but says husband notices her to wheeze sometimes at night.  Denis dyspnea or chest tightness but chest feels severly congested. Pulmicort was advised by PMD but she never took it.   RSI cough score 28 - level 5 for hoarseness, clearing of throat, and post nasal drop, and annoying cough and lump in throat. Level 1 for difficulty swalling, cughing after lying down, choking episodes\\  Of note saw Dr Sherene Sires in 2007 for cough - sinus etiology considered at that time per his notes. CT 07/03/05 showed thickening of mucosa but no sinusitis  REC Cough is from sinus drainage, acid reflux, asthma, and vocal cord dysfunction All of this is working together to cause cyclical cough/LPR cough  #Sinus drainage  - start/continue netti pot daily (nurse will show you picture) - start antihistamine chlorpheniramine 4mg  tablet 1-2 at night; if this makes you too sleepy or dry cut down (nurse will do script) - start nasal steroid generic fluticasone inhaler 2 squirts each nostril daily as advised (nurse will do script)  #Possible Acid Reflux  - take otc zegerid 20mg   1 capsule daily on empty stomach   - take  diet sheet from [place] - avoid colas, spices, cheeses, spirits, red meats, beer, chocolates, fried foods etc.,   - sleep with head end of bed elevated  - eat small frequent meals  - do not go to bed for 3 hours after last meal - stop fish oil  #Possible Asthma   - start QVAR 2 puff twice daily - take samples and script  and show inhaler technique  #Cyclical cough  - please choose 2-3 days and observe complete voice rest - no talking or whispering  - at all times there  there is urge to cough, drink water or swallow or sip on throat lozenge  #Followup - I will see you in 4-6 weeks.  - any problems call or come sooner  OV 05/03/2011 Followup Cough. Following instructions. For sinus: using netti pot, flonase and chlorpheniramine. Feels ssinus issues are better. Following all GERD measures but diet is tough. For possible asthma: using qvar. For possible cyclical cough: did not do voice rest but trying to break cycle.   With this cough improved subjetively 80%. RSI cough soccre is 14 - which is 50% better on objective measure. STill has level 5 feeling of lump in throat, something sticky in throat. Level 3 - post nasal drip, and level 2 - annying cough. Level 1 - heartburn and hoarseness of voice  She feels throat is big issue and wants rule out asthma. Feels throat is bothering her a lot.  FEels she is wanting to go back to ENT.  Past, Family, Social reviewed: one of her 47 year old twins is hospitalized now with RSV and strep throat.    Review of Systems  Constitutional: Negative for fever and unexpected weight change.  HENT: Positive for congestion, sore throat, sneezing, postnasal drip and sinus pressure. Negative for ear pain, nosebleeds, rhinorrhea, trouble swallowing and dental problem.   Eyes: Negative for redness and itching.  Respiratory: Positive for cough, shortness of breath and wheezing. Negative for chest tightness.   Cardiovascular: Positive for leg swelling. Negative for  palpitations.  Gastrointestinal: Negative for nausea, vomiting and diarrhea.  Genitourinary: Negative for dysuria.  Musculoskeletal: Negative for joint swelling.  Skin: Negative for rash.  Neurological: Positive for headaches.  Hematological: Does not bruise/bleed easily.  Psychiatric/Behavioral: Negative for dysphoric mood. The patient is not nervous/anxious.        Objective:   Physical Exam Physical Exam  Vitals reviewed. Constitutional: She is oriented to person, place, and time. She appears well-developed and well-nourished. No distress.       obese  HENT:  Head: Normocephalic and atraumatic.  Right Ear: External ear normal.  Left Ear: External ear normal.  Mouth/Throat: Oropharynx is clear and moist. No oropharyngeal exudate.  Eyes: Conjunctivae and EOM are normal. Pupils are equal, round, and reactive to light. Right eye exhibits no discharge. Left eye exhibits no discharge. No scleral icterus.  Neck: Normal range of motion. Neck supple. No JVD present. No tracheal deviation present. No thyromegaly present.       Small white fleck on left tonsil - says saw ENT a year ago and cleared  Cardiovascular: Normal rate, regular rhythm, normal heart sounds and intact distal pulses.  Exam reveals no gallop and no friction rub.  However this time  a murmur heard. Pulmonary/Chest: Effort normal and breath sounds normal. No respiratory distress. She has no wheezes. She has no rales. She exhibits no tenderness.  Abdominal: Soft. Bowel sounds are normal. She exhibits no distension and no mass. There is no tenderness. There is no rebound and no guarding.  Musculoskeletal: Normal range of motion. She exhibits no edema and no tenderness.  Lymphadenopathy:    She has no cervical adenopathy.  Neurological: She is alert and oriented to person, place, and time. She has normal reflexes. No cranial nerve deficit. She exhibits normal muscle tone. Coordination normal.  Skin: Skin is warm and dry. No  rash noted. She is not diaphoretic. No erythema. No pallor.  Psychiatric: She has a normal mood and affect. Her behavior is normal. Judgment and thought content normal.         Assessment & Plan:

## 2011-05-03 NOTE — Patient Instructions (Signed)
Cough is from sinus drainage, acid reflux, possible asthma, and vocal cord dysfunction All of this is working together to cause cyclical cough/LPR cough  #Sinus drainage  - continue netti pot daily  - continue  antihistamine chlorpheniramine 4mg  tablet 1-2 at night; if this makes you too sleepy or dry cut down  - continue nasal steroid generic fluticasone inhaler 2 squirts each nostril daily as advised  - We will re-refer you to your ENT doctor  #Possible Acid Reflux  - continue otc zegerid 20mg   1 capsule daily on empty stomach   - continuediet sheet from [place] - avoid colas, spices, cheeses, spirits, red meats, beer, chocolates, fried foods etc.,   - sleep with head end of bed elevated  - eat small frequent meals  - do not go to bed for 3 hours after last meal - continue to avoid fish oil  #Possible Asthma   - STOP  QVAR  - HAVE METHACHOLINE CHALLENGE TEST  #Cyclical cough  - please choose 2-3 days and observe complete voice rest - no talking or whispering  - at all times there  there is urge to cough, drink water or swallow or sip on throat lozenge  #Followup - I will see you in 4-6 weeks.  - any problems call or come sooner - Cough score at followup  - REfer Dr Shon Baton for heart murmur

## 2011-05-11 ENCOUNTER — Encounter: Payer: Self-pay | Admitting: Internal Medicine

## 2011-05-11 DIAGNOSIS — R011 Cardiac murmur, unspecified: Secondary | ICD-10-CM | POA: Insufficient documentation

## 2011-05-11 NOTE — Assessment & Plan Note (Signed)
I discussed this finding with the patient prefers to see Dr. Jacinto Halim cardiologist

## 2011-05-11 NOTE — Assessment & Plan Note (Signed)
Cough is significantly better following measures again sinus and acid reflux disease but by history sinus bothers her the most and the question of asthma is not definitive. Therefore we'll look further into the sinuses by getting her back to the ENT physician. We will look at asthma further by getting a methacholine challenge test after she trials for off inhaled corticosteroids for several weeks. In the interim she is to continue her anti-sinus and acid reflux measures aggressively particularly the diet which I  emphasized again  Greater than 50% of time for this 15 minute visit was spent in counseling face-to-face

## 2011-05-17 ENCOUNTER — Encounter (HOSPITAL_COMMUNITY): Payer: 59

## 2011-05-28 ENCOUNTER — Ambulatory Visit (HOSPITAL_COMMUNITY)
Admission: RE | Admit: 2011-05-28 | Discharge: 2011-05-28 | Disposition: A | Discharge status: Home / Self Care | Payer: 59 | Source: Ambulatory Visit | Attending: Internal Medicine | Admitting: Internal Medicine

## 2011-05-28 DIAGNOSIS — R0989 Other specified symptoms and signs involving the circulatory and respiratory systems: Secondary | ICD-10-CM | POA: Insufficient documentation

## 2011-05-28 DIAGNOSIS — R0609 Other forms of dyspnea: Secondary | ICD-10-CM | POA: Insufficient documentation

## 2011-05-28 MED ORDER — METHACHOLINE 0.0625 MG/ML NEB SOLN
2.0000 mL | Freq: Once | RESPIRATORY_TRACT | Status: AC
  Administered 2011-05-28: 0.125 mg via RESPIRATORY_TRACT

## 2011-05-28 MED ORDER — ALBUTEROL SULFATE (5 MG/ML) 0.5% IN NEBU
2.5000 mg | INHALATION_SOLUTION | Freq: Once | RESPIRATORY_TRACT | Status: AC
  Administered 2011-05-28: 2.5 mg via RESPIRATORY_TRACT

## 2011-05-28 MED ORDER — SODIUM CHLORIDE 0.9 % IN NEBU
3.0000 mL | INHALATION_SOLUTION | Freq: Once | RESPIRATORY_TRACT | Status: AC
  Administered 2011-05-28: 3 mL via RESPIRATORY_TRACT

## 2011-05-28 MED ORDER — METHACHOLINE 0.25 MG/ML NEB SOLN
2.0000 mL | Freq: Once | RESPIRATORY_TRACT | Status: AC
  Administered 2011-05-28: 0.5 mg via RESPIRATORY_TRACT

## 2011-05-28 MED ORDER — METHACHOLINE 1 MG/ML NEB SOLN
2.0000 mL | Freq: Once | RESPIRATORY_TRACT | Status: AC
  Administered 2011-05-28: 2 mg via RESPIRATORY_TRACT

## 2011-05-28 MED ORDER — METHACHOLINE 16 MG/ML NEB SOLN: 2.0000 mL | Freq: Once | RESPIRATORY_TRACT | Status: DC

## 2011-05-28 MED ORDER — METHACHOLINE 4 MG/ML NEB SOLN: 2.0000 mL | Freq: Once | RESPIRATORY_TRACT | Status: DC

## 2011-06-06 ENCOUNTER — Telehealth: Payer: Self-pay | Admitting: Internal Medicine

## 2011-06-06 NOTE — Telephone Encounter (Signed)
Methacholine challenge test 05/28/11 strongly positive for asthma. Please have her come for  OV first avail, She was supposed to come 4-6 weeks from prior OV anyways

## 2011-06-07 NOTE — Telephone Encounter (Signed)
[id number].Lurlean Kernen, CMA  

## 2011-06-08 NOTE — Telephone Encounter (Signed)
Pt aware. Kareen Hitsman, CMA  

## 2011-06-11 ENCOUNTER — Encounter: Payer: Self-pay | Admitting: Internal Medicine

## 2012-04-04 ENCOUNTER — Other Ambulatory Visit: Payer: Self-pay | Admitting: Internal Medicine

## 2012-04-04 ENCOUNTER — Ambulatory Visit
Admission: RE | Admit: 2012-04-04 | Discharge: 2012-04-04 | Disposition: A | Discharge status: Home / Self Care | Payer: 59 | Source: Ambulatory Visit | Attending: Internal Medicine | Admitting: Internal Medicine

## 2012-04-04 DIAGNOSIS — Z1231 Encounter for screening mammogram for malignant neoplasm of breast: Secondary | ICD-10-CM

## 2012-04-08 ENCOUNTER — Other Ambulatory Visit: Payer: Self-pay | Admitting: Internal Medicine

## 2012-04-08 DIAGNOSIS — R928 Other abnormal and inconclusive findings on diagnostic imaging of breast: Secondary | ICD-10-CM

## 2012-04-17 ENCOUNTER — Other Ambulatory Visit: Payer: Self-pay | Admitting: Internal Medicine

## 2012-04-17 ENCOUNTER — Ambulatory Visit
Admission: RE | Admit: 2012-04-17 | Discharge: 2012-04-17 | Disposition: A | Discharge status: Home / Self Care | Payer: 59 | Source: Ambulatory Visit | Attending: Internal Medicine | Admitting: Internal Medicine

## 2012-04-17 DIAGNOSIS — R928 Other abnormal and inconclusive findings on diagnostic imaging of breast: Secondary | ICD-10-CM

## 2012-05-15 ENCOUNTER — Other Ambulatory Visit: Payer: Self-pay | Admitting: Internal Medicine

## 2013-03-23 ENCOUNTER — Other Ambulatory Visit: Payer: Self-pay | Admitting: Occupational Medicine

## 2013-03-23 ENCOUNTER — Ambulatory Visit: Payer: Self-pay

## 2013-03-23 DIAGNOSIS — R52 Pain, unspecified: Secondary | ICD-10-CM

## 2013-04-24 ENCOUNTER — Other Ambulatory Visit: Payer: Self-pay

## 2013-04-24 DIAGNOSIS — Z1231 Encounter for screening mammogram for malignant neoplasm of breast: Secondary | ICD-10-CM

## 2013-06-22 ENCOUNTER — Ambulatory Visit (HOSPITAL_COMMUNITY)
Admission: RE | Admit: 2013-06-22 | Discharge: 2013-06-22 | Disposition: A | Discharge status: Home / Self Care | Payer: 59 | Source: Ambulatory Visit | Attending: Nurse Practitioner | Admitting: Nurse Practitioner

## 2013-06-22 ENCOUNTER — Other Ambulatory Visit (HOSPITAL_COMMUNITY): Payer: Self-pay | Admitting: Nurse Practitioner

## 2013-06-22 DIAGNOSIS — R05 Cough: Secondary | ICD-10-CM

## 2013-06-22 DIAGNOSIS — R059 Cough, unspecified: Secondary | ICD-10-CM | POA: Insufficient documentation

## 2014-06-28 LAB — HM COLONOSCOPY

## 2014-09-02 ENCOUNTER — Encounter: Payer: 59 | Attending: Obstetrics and Gynecology | Admitting: Dietician

## 2014-09-02 ENCOUNTER — Encounter: Payer: Self-pay | Admitting: Dietician

## 2014-09-02 DIAGNOSIS — R7309 Other abnormal glucose: Secondary | ICD-10-CM | POA: Insufficient documentation

## 2014-09-02 DIAGNOSIS — Z713 Dietary counseling and surveillance: Secondary | ICD-10-CM | POA: Insufficient documentation

## 2014-09-02 DIAGNOSIS — R7303 Prediabetes: Secondary | ICD-10-CM

## 2014-09-02 NOTE — Progress Notes (Signed)
  Medical Nutrition Therapy:  Appt start time: 0845 end time:  0930.   Assessment:  Primary concerns today: Paula Sparks is here today reporting issues with recent weight gain; patient declined updated weight today. She was referred for prediabetes, HgbA1c 6.4%. 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 She lives with her husband and 51-year old twin boys. She states she has tried to make healthier lifestyle changes starting at the beginning of March by replacing meals with Herbalife shakes, adding exercise, and trying to eat healthier overall. She had gestational diabetes with her pregnancy 5 years ago and received nutrition education at [facility].  Preferred Learning Style:   No preference indicated   Learning Readiness:   Ready  MEDICATIONS: see list   DIETARY INTAKE:  Avoided foods include Brussels sprouts.    24-hr recall:  B ( AM): 1 cup of oatmeal with skim milk and stevia  Snk ( AM): Herbalife protein shake   L ( PM): macaroni or salad  D (6:30 PM): protein shake  Snk: (10:30-11PM): shake 2-3 AM: soup and salad with lite ranch  Beverages: water, coffee with cream and stevia  Usual physical activity: Shaun T 30 minute videos and walking for 30-45 minutes (5 days a week usually)  Estimated energy needs: 1500-1700 calories 170-180 g carbohydrates (recommended about 30 grams per meal)  Progress Towards Goal(s):  In progress.   Nutritional Diagnosis:  [place]-2.2 Altered nutrition-related laboratory As related to obesity and history of excessive carbohydrate intake.  As evidenced by HgbA1c 6.4%.    Intervention:  Nutrition counseling provided. Praised patient on recent healthy lifestyle changes. Explained carbohydrate metabolism and glucose uptake. Identified carbohydrate foods and demonstrated appropriate portion sizes using food models. Encouraged continued exercise and weight loss.  Goals: -Work on taking care of yourself  -Exercising/walking, gardening -Continue to try to  eat every 3-5 hours that you are awake -Have a protein food with each meal and snack  -Egg, low fat cheese or cottage cheese, nuts, yogurt, [employer] protein bar -Stash healthy snacks at work -Limit carbs to 30 grams per meal  Teaching Method Utilized:  Visual Auditory Hands on  Handouts given during visit include:  Living Well with Diabetes booklet  MyPlate  Meal planning card  Barriers to learning/adherence to lifestyle change: night shift  Demonstrated degree of understanding via:  Teach Back   Monitoring/Evaluation:  Dietary intake, exercise, labs, and body weight in 4 month(s).

## 2014-09-02 NOTE — Patient Instructions (Addendum)
-  Work on taking care of yourself  -Exercising/walking, gardening  -Continue to try to eat every 3-5 hours that you are awake  -Have a protein food with each meal and snack  -Egg, low fat cheese or cottage cheese, nuts, yogurt, [employer] protein bar  -Stash healthy snacks at work  -Limit carbs to 30 grams per meal

## 2014-11-09 ENCOUNTER — Emergency Department (HOSPITAL_COMMUNITY)
Admission: EM | Admit: 2014-11-09 | Discharge: 2014-11-09 | Disposition: A | Discharge status: Home / Self Care | Payer: 59 | Source: Home / Self Care | Attending: Family Medicine | Admitting: Family Medicine

## 2014-11-09 ENCOUNTER — Encounter (HOSPITAL_COMMUNITY): Payer: Self-pay | Admitting: *Deleted

## 2014-11-09 DIAGNOSIS — J069 Acute upper respiratory infection, unspecified: Secondary | ICD-10-CM | POA: Diagnosis not present

## 2014-11-09 MED ORDER — IPRATROPIUM BROMIDE 0.06 % NA SOLN: 2.0000 | Freq: Four times a day (QID) | NASAL | Status: DC

## 2014-11-09 MED ORDER — AZITHROMYCIN 250 MG PO TABS: ORAL_TABLET | ORAL | Status: DC

## 2014-11-09 NOTE — Discharge Instructions (Signed)
Drink plenty of fluids as discussed, use medicine as prescribed, and mucinex or delsym for cough. Return or see your doctor if further problems °

## 2014-11-09 NOTE — ED Provider Notes (Signed)
CSN: [id number]     Arrival date & time 11/09/14  1301 History   First MD Initiated Contact with Patient 11/09/14 1321     Chief Complaint  Patient presents with  . URI   (Consider location/radiation/quality/duration/timing/severity/associated sxs/prior Treatment) Patient is a 49 y.o. female presenting with URI. The history is provided by the patient.  URI Presenting symptoms: congestion, cough and rhinorrhea   Presenting symptoms: no fever   Severity:  Mild Onset quality:  Gradual Duration:  2 days Progression:  Unchanged Chronicity:  New Relieved by:  None tried Worsened by:  Nothing tried Ineffective treatments:  OTC medications Associated symptoms: no wheezing   Risk factors: no sick contacts     Past Medical History  Diagnosis Date  . Hyperlipidemia   . Cough   . Murmur, heart   . GERD (gastroesophageal reflux disease)    Past Surgical History  Procedure Laterality Date  . Nasal sinus surgery    . Breast lumpectomy  2003  . Cesarean section     History reviewed. No pertinent family history. History  Substance Use Topics  . Smoking status: Never Smoker   . Smokeless tobacco: Never Used  . Alcohol Use: No   OB History    No data available     Review of Systems  Constitutional: Negative.  Negative for fever.  HENT: Positive for congestion, postnasal drip and rhinorrhea.   Respiratory: Positive for cough. Negative for shortness of breath and wheezing.   Cardiovascular: Negative.   Gastrointestinal: Negative.     Allergies  Review of patient's allergies indicates no known allergies.  Home Medications   Prior to Admission medications   Medication Sig Start Date End Date Taking? Authorizing Provider  azithromycin (ZITHROMAX Z-PAK) 250 MG tablet Take as directed on pack 11/09/14   Linna HoffJames D Kindl, MD  beclomethasone (QVAR) 80 MCG/ACT inhaler Inhale 2 puffs into the lungs 2 (two) times daily. 04/03/11 04/02/12  Kalman ShanMurali Ramaswamy, MD  cetirizine (ZYRTEC) 10 MG  tablet Take 10 mg by mouth daily.    Historical Provider, MD  chlorpheniramine (CHLOR-TRIMETON) 4 MG tablet Take 1-2 tablets at night 04/03/11   Kalman ShanMurali Ramaswamy, MD  fish oil-omega-3 fatty acids 1000 MG capsule Take 2 g by mouth daily.      Historical Provider, MD  fluticasone (FLONASE) 50 MCG/ACT nasal spray Place 2 sprays into the nose daily. 04/03/11 04/02/12  Kalman ShanMurali Ramaswamy, MD  ipratropium (ATROVENT) 0.06 % nasal spray Place 2 sprays into both nostrils 4 (four) times daily. 11/09/14   Linna HoffJames D Kindl, MD  meloxicam (MOBIC) 15 MG tablet Take 15 mg by mouth daily.    Historical Provider, MD  metFORMIN (GLUCOPHAGE) 500 MG tablet Take by mouth 2 (two) times daily with a meal.    Historical Provider, MD  montelukast (SINGULAIR) 10 MG tablet Take 10 mg by mouth at bedtime.    Historical Provider, MD  Multiple Vitamins-Minerals (MULTIVITAMIN WITH MINERALS) tablet Take 1 tablet by mouth daily.      Historical Provider, MD  Omeprazole-Sodium Bicarbonate (ZEGERID OTC) 20-1100 MG CAPS Take 1 capsule on a empty stomach 04/03/11   Kalman ShanMurali Ramaswamy, MD   LMP 11/01/2014 Physical Exam  Constitutional: She is oriented to person, place, and time. She appears well-developed and well-nourished. No distress.  HENT:  Right Ear: External ear normal.  Left Ear: External ear normal.  Mouth/Throat: Oropharynx is clear and moist.  Eyes: Pupils are equal, round, and reactive to light.  Neck: Normal range of motion. Neck  supple.  Cardiovascular: Normal rate, regular rhythm, normal heart sounds and intact distal pulses.   Pulmonary/Chest: Effort normal and breath sounds normal.  Lymphadenopathy:    She has no cervical adenopathy.  Neurological: She is alert and oriented to person, place, and time.  Skin: Skin is warm and dry.  Nursing note and vitals reviewed.   ED Course  Procedures (including critical care time) Labs Review Labs Reviewed - No data to display  Imaging Review No results found.   MDM    1. URI (upper respiratory infection)        Linna Hoff, MD 11/09/14 1343

## 2014-11-09 NOTE — ED Notes (Signed)
PT  HAS  SYMPTOMS  OF  COUGH   AND  CONGESTED         WITH    [facility]  [facility]

## 2015-01-06 ENCOUNTER — Ambulatory Visit: Payer: Self-pay | Admitting: Dietician

## 2015-03-14 ENCOUNTER — Encounter: Payer: Self-pay | Admitting: Family Medicine

## 2015-03-14 ENCOUNTER — Other Ambulatory Visit (INDEPENDENT_AMBULATORY_CARE_PROVIDER_SITE_OTHER): Payer: 59

## 2015-03-14 ENCOUNTER — Ambulatory Visit (INDEPENDENT_AMBULATORY_CARE_PROVIDER_SITE_OTHER): Payer: 59 | Admitting: Family Medicine

## 2015-03-14 VITALS — BP 112/80 | HR 67 | Ht 60.0 in | Wt 199.0 lb

## 2015-03-14 DIAGNOSIS — M216X2 Other acquired deformities of left foot: Secondary | ICD-10-CM

## 2015-03-14 DIAGNOSIS — M216X1 Other acquired deformities of right foot: Secondary | ICD-10-CM | POA: Diagnosis not present

## 2015-03-14 DIAGNOSIS — M79672 Pain in left foot: Secondary | ICD-10-CM | POA: Diagnosis not present

## 2015-03-14 DIAGNOSIS — M79671 Pain in right foot: Secondary | ICD-10-CM

## 2015-03-14 DIAGNOSIS — M2141 Flat foot [pes planus] (acquired), right foot: Secondary | ICD-10-CM | POA: Diagnosis not present

## 2015-03-14 DIAGNOSIS — M2142 Flat foot [pes planus] (acquired), left foot: Secondary | ICD-10-CM

## 2015-03-14 DIAGNOSIS — M76821 Posterior tibial tendinitis, right leg: Secondary | ICD-10-CM

## 2015-03-14 DIAGNOSIS — M76829 Posterior tibial tendinitis, unspecified leg: Secondary | ICD-10-CM | POA: Insufficient documentation

## 2015-03-14 MED ORDER — DICLOFENAC SODIUM 2 % TD SOLN: TRANSDERMAL | Status: DC

## 2015-03-14 MED ORDER — IBUPROFEN-FAMOTIDINE 800-26.6 MG PO TABS: ORAL_TABLET | ORAL | Status: DC

## 2015-03-14 NOTE — Progress Notes (Signed)
Pre visit review using our clinic review tool, if applicable. No additional management support is needed unless otherwise documented below in the visit note. 

## 2015-03-14 NOTE — Progress Notes (Signed)
[name] D.O. [facility] [address] [place], [place] [address] Phone: [phone] Subjective:    I'm seeing this patient by the request  of:  Sparks, Paula ANN, NP   CC: Right foot pain  OZH:[id number]:Subjective Paula Sparks is a 49 y.o. female coming in with complaint of right foot pain. Patient is had this pain since she broke her foot back in 2011. Patient did have an avulsion fracture noted of the calcaneal region as well as the lateral margin of the calcaneocuboid joint. Patient states then this is more of her right foot though. Patient states that it seems to be more on the anterior aspect of the ankle. Hurts more after sitting a long amount of time. Denies though any numbness. States that it can be even a soreness with walking. Has tried changing shoes with no significant improvement. Has tried some over-the-counter anti-inflammatories which are helpful. Patient does do a lot of walking and states that this seems to have aggravated. When she is off multiple days in a row it seems to get better. No nighttime awakening. Rates the severity of pain a 6 out of 10.     Past Medical History  Diagnosis Date  . Hyperlipidemia   . Cough   . Murmur, heart   . GERD (gastroesophageal reflux disease)    Past Surgical History  Procedure Laterality Date  . Nasal sinus surgery    . Breast lumpectomy  2003  . Cesarean section     Social History  Substance Use Topics  . Smoking status: Never Smoker   . Smokeless tobacco: Never Used  . Alcohol Use: No   No Known Allergies No family history on file.   Past medical history, social, surgical and family history all reviewed in electronic medical record.   Review of Systems: No headache, visual changes, nausea, vomiting, diarrhea, constipation, dizziness, abdominal pain, skin rash, fevers, chills, night sweats, weight loss, swollen lymph nodes, body aches, joint swelling, muscle aches, chest pain, shortness of breath, mood  changes.   Objective Blood pressure 112/80, pulse 67, height 5' (1.524 m), weight 199 lb (90.266 kg), SpO2 98 %.  General: No apparent distress alert and oriented x3 mood and affect normal, dressed appropriately.  HEENT: Pupils equal, extraocular movements intact  Respiratory: Patient's speak in full sentences and does not appear short of breath  Cardiovascular: No lower extremity edema, non tender, no erythema  Skin: Warm dry intact with no signs of infection or rash on extremities or on axial skeleton.  Abdomen: Soft nontender  Neuro: Cranial nerves II through XII are intact, neurovascularly intact in all extremities with 2+ DTRs and 2+ pulses.  Lymph: No lymphadenopathy of posterior or anterior cervical chain or axillae bilaterally.  Gait normal with good balance and coordination.  MSK:  Non tender with full range of motion and good stability and symmetric strength and tone of shoulders, elbows, wrist, hip, knee and ankles bilaterally.  Foot exam shows patient does have pes planus bilaterally. Patient does have tenderness to palpation around the posterior tibialis tendon on the right side. Patient does have overpronation of the hindfoot bilaterally. Mild hallux rigidus bilaterally. No specific area of tenderness otherwise around the posterior tibialis.  MSK US performed of: left This study was ordered, performed, and interpreted by Terrilee FilesZach Raphaela Cannaday D.O.  Foot/Ankle:   All structures visualized.   Talar dome unremarkable  Ankle mortise without effusion. Peroneus longus and brevis tendons unremarkable on long and transverse views without sheath  effusions. Posterior tibialis hypoechoic changes possible mild scar tissue formation. flexor hallucis longus, and flexor digitorum longus tendons unremarkable on long and transverse views without sheath effusions. Achilles tendon visualized along length of tendon and unremarkable on long and transverse views without sheath effusion. Anterior Talofibular  Ligament and Calcaneofibular Ligaments unremarkable and intact. Deltoid Ligament unremarkable and intact. Plantar fascia intact and without effusion, normal thickness. No increased doppler signal, cap sign, or thickening of tibial cortex. Power doppler signal normal.  IMPRESSION:  Post tibialis tendonitis.       Impression and Recommendations:     This case required medical decision making of moderate complexity.

## 2015-03-14 NOTE — Patient Instructions (Addendum)
Good to see you.  Ice bath at night can help Wear brace with a lot of activity Exercises 3 times a week.  Good shoes with rigid bottom.  Paula Sparks, Dansko, Merrell or New balance greater then 700 pennsaid pinkie amount topically 2 times daily as needed.  Duexis 3 times daily as needed Vitamin D 2000 IU daily Turmeric 500mg  twice daily See me again in 3-4 weeks We may need to consider custom orthotics.

## 2015-03-14 NOTE — Assessment & Plan Note (Signed)
Patient does have some tendinitis of the posterior tibialis tendon. We discussed that this is likely also some insufficiency comment patient have breakdown of the longitudinal arch. Patient does have significant over pronation and flatfoot that is also contribute. Encourage her to wear proper shoes, icing protocol, topical anti-inflammatories as well as oral anti-inflammatories. Patient will do home exercises and does have a brace. Patient come back and see me again in 3-4 weeks. If continuing have pain we will consider injection.

## 2015-03-21 ENCOUNTER — Emergency Department (INDEPENDENT_AMBULATORY_CARE_PROVIDER_SITE_OTHER): Payer: 59

## 2015-03-21 ENCOUNTER — Emergency Department (HOSPITAL_COMMUNITY)
Admission: EM | Admit: 2015-03-21 | Discharge: 2015-03-21 | Disposition: A | Discharge status: Home / Self Care | Payer: 59 | Source: Home / Self Care

## 2015-03-21 ENCOUNTER — Encounter (HOSPITAL_COMMUNITY): Payer: Self-pay | Admitting: Emergency Medicine

## 2015-03-21 DIAGNOSIS — J069 Acute upper respiratory infection, unspecified: Secondary | ICD-10-CM

## 2015-03-21 MED ORDER — AMOXICILLIN 500 MG PO CAPS: 500.0000 mg | ORAL_CAPSULE | Freq: Three times a day (TID) | ORAL | Status: DC

## 2015-03-21 NOTE — ED Notes (Signed)
Cough and cold symptoms for 2 days: reports soreness around rib cage that is worse with coughing.  Patient has a cough, yellow phlegm, runny nose, stuffy ears, throat soreness

## 2015-03-21 NOTE — Discharge Instructions (Signed)
Upper Respiratory Infection, Adult Most upper respiratory infections (URIs) are a viral infection of the air passages leading to the lungs. A URI affects the nose, throat, and upper air passages. The most common type of URI is nasopharyngitis and is typically referred to as "the common cold." URIs run their course and usually go away on their own. Most of the time, a URI does not require medical attention, but sometimes a bacterial infection in the upper airways can follow a viral infection. This is called a secondary infection. Sinus and middle ear infections are common types of secondary upper respiratory infections. Bacterial pneumonia can also complicate a URI. A URI can worsen asthma and chronic obstructive pulmonary disease (COPD). Sometimes, these complications can require emergency medical care and may be life threatening.  CAUSES Almost all URIs are caused by viruses. A virus is a type of germ and can spread from one person to another.  RISKS FACTORS You may be at risk for a URI if:   You smoke.   You have chronic heart or lung disease.  You have a weakened defense (immune) system.   You are very young or very old.   You have nasal allergies or asthma.  You work in crowded or poorly ventilated areas.  You work in health care facilities or schools. SIGNS AND SYMPTOMS  Symptoms typically develop 2-3 days after you come in contact with a cold virus. Most viral URIs last 7-10 days. However, viral URIs from the influenza virus (flu virus) can last 14-18 days and are typically more severe. Symptoms may include:   Runny or stuffy (congested) nose.   Sneezing.   Cough.   Sore throat.   Headache.   Fatigue.   Fever.   Loss of appetite.   Pain in your forehead, behind your eyes, and over your cheekbones (sinus pain).  Muscle aches.  DIAGNOSIS  Your health care provider may diagnose a URI by:  Physical exam.  Tests to check that your symptoms are not due to  another condition such as:  Strep throat.  Sinusitis.  Pneumonia.  Asthma. TREATMENT  A URI goes away on its own with time. It cannot be cured with medicines, but medicines may be prescribed or recommended to relieve symptoms. Medicines may help:  Reduce your fever.  Reduce your cough.  Relieve nasal congestion. HOME CARE INSTRUCTIONS   Take medicines only as directed by your health care provider.   Gargle warm saltwater or take cough drops to comfort your throat as directed by your health care provider.  Use a warm mist humidifier or inhale steam from a shower to increase air moisture. This may make it easier to breathe.  Drink enough fluid to keep your urine clear or pale yellow.   Eat soups and other clear broths and maintain good nutrition.   Rest as needed.   Return to work when your temperature has returned to normal or as your health care provider advises. You may need to stay home longer to avoid infecting others. You can also use a face mask and careful hand washing to prevent spread of the virus.  Increase the usage of your inhaler if you have asthma.   Do not use any tobacco products, including cigarettes, chewing tobacco, or electronic cigarettes. If you need help quitting, ask your health care provider. PREVENTION  The best way to protect yourself from getting a cold is to practice good hygiene.   Avoid oral or hand contact with people with cold   symptoms.   Wash your hands often if contact occurs.  There is no clear evidence that vitamin C, vitamin E, echinacea, or exercise reduces the chance of developing a cold. However, it is always recommended to get plenty of rest, exercise, and practice good nutrition.  SEEK MEDICAL CARE IF:   You are getting worse rather than better.   Your symptoms are not controlled by medicine.   You have chills.  You have worsening shortness of breath.  You have brown or red mucus.  You have yellow or brown nasal  discharge.  You have pain in your face, especially when you bend forward.  You have a fever.  You have swollen neck glands.  You have pain while swallowing.  You have white areas in the back of your throat. SEEK IMMEDIATE MEDICAL CARE IF:   You have severe or persistent:  Headache.  Ear pain.  Sinus pain.  Chest pain.  You have chronic lung disease and any of the following:  Wheezing.  Prolonged cough.  Coughing up blood.  A change in your usual mucus.  You have a stiff neck.  You have changes in your:  Vision.  Hearing.  Thinking.  Mood. MAKE SURE YOU:   Understand these instructions.  Will watch your condition.  Will get help right away if you are not doing well or get worse.   This information is not intended to replace advice given to you by your health care provider. Make sure you discuss any questions you have with your health care provider.   Document Released: 10/24/2000 Document Revised: 09/14/2014 Document Reviewed: 08/05/2013 Elsevier Interactive Patient Education 2016 [employer]  

## 2015-03-21 NOTE — ED Provider Notes (Signed)
CSN: [id number]     Arrival date & time 03/21/15  1934 History   None    No chief complaint on file.  (Consider location/radiation/quality/duration/timing/severity/associated sxs/prior Treatment) HPI History obtained from patient:   LOCATION:upper resp SEVERITY:no pain DURATION: 2 days CONTEXT:sudden onset  QUALITY: MODIFYING FACTORS: OTC meds without relief of symptoms ASSOCIATED SYMPTOMS:stuffy ears, throat TIMING:constant  Past Medical History  Diagnosis Date  . Hyperlipidemia   . Cough   . Murmur, heart   . GERD (gastroesophageal reflux disease)    Past Surgical History  Procedure Laterality Date  . Nasal sinus surgery    . Breast lumpectomy  2003  . Cesarean section     No family history on file. Social History  Substance Use Topics  . Smoking status: Never Smoker   . Smokeless tobacco: Never Used  . Alcohol Use: No   OB History    No data available     Review of Systems ROS +'vecough, sore throat, runny nose  Denies: HEADACHE, NAUSEA, ABDOMINAL PAIN, CHEST PAIN, CONGESTION, DYSURIA, SHORTNESS OF BREATH  Allergies  Review of patient's allergies indicates no known allergies.  Home Medications   Prior to Admission medications   Medication Sig Start Date End Date Taking? Authorizing Provider  beclomethasone (QVAR) 80 MCG/ACT inhaler Inhale 2 puffs into the lungs 2 (two) times daily. 04/03/11 03/14/15  Kalman Shan, MD  cetirizine (ZYRTEC) 10 MG tablet Take 10 mg by mouth daily.    Historical Provider, MD  chlorpheniramine (CHLOR-TRIMETON) 4 MG tablet Take 1-2 tablets at night 04/03/11   Kalman Shan, MD  Diclofenac Sodium 2 % SOLN Apply 1 pump twice daily as needed. 03/14/15   Judi Saa, DO  fish oil-omega-3 fatty acids 1000 MG capsule Take 2 g by mouth daily.      Historical Provider, MD  fluticasone (FLONASE) 50 MCG/ACT nasal spray Place 2 sprays into the nose daily. 04/03/11 03/14/15  Kalman Shan, MD  Ibuprofen-Famotidine 800-26.6 MG  TABS Take 1 tablet 3 times daily as needed. 03/14/15   Judi Saa, DO  ipratropium (ATROVENT) 0.06 % nasal spray Place 2 sprays into both nostrils 4 (four) times daily. 11/09/14   Linna Hoff, MD  metFORMIN (GLUCOPHAGE) 500 MG tablet Take by mouth 2 (two) times daily with a meal.    Historical Provider, MD  montelukast (SINGULAIR) 10 MG tablet Take 10 mg by mouth at bedtime.    Historical Provider, MD  Multiple Vitamins-Minerals (MULTIVITAMIN WITH MINERALS) tablet Take 1 tablet by mouth daily.      Historical Provider, MD  Omeprazole-Sodium Bicarbonate (ZEGERID OTC) 20-1100 MG CAPS Take 1 capsule on a empty stomach 04/03/11   Kalman Shan, MD   Meds Ordered and Administered this Visit  Medications - No data to display  BP 143/73 mmHg  Pulse 82  Temp(Src) 98.4 F (36.9 C) (Oral)  Resp 16  SpO2 100%  LMP 03/04/2015 No data found.   Physical Exam NURSES NOTES AND VITAL SIGNS REVIEWED. CONSTITUTIONAL: Well developed, well nourished, no acute distress HEENT: normocephalic, atraumatic EYES: Conjunctiva normal NECK:normal ROM, supple PULMONARY:No respiratory distress, normal effort, Lungs: CTAb/l CARDIOVASCULAR: RRR, no murmur ABDOMEN: soft, ND, NT, +'ve BS MUSCULOSKELETAL: Normal ROM of all extremities SKIN: warm and dry without rash PSYCHIATRIC: Mood and affect normal  ED Course  Procedures (including critical care time)  Labs Review Labs Reviewed - No data to display  Imaging Review No results found.   Visual Acuity Review  Right Eye Distance:   Left  Eye Distance:   Bilateral Distance:    Right Eye Near:   Left Eye Near:    Bilateral Near:         MDM   1. URI (upper respiratory infection)   Treatment options discussed. No indications for investigations at this time.   Pt is stable for discharge at this time. Rx (amoxil )  Continue symptomatic treatment at home. Advised to follow up with PCP or return to the clinic if there are new or worsening  of symptoms.  All questions answered.  Instructions of care provided.         Tharon AquasFrank C Moriyah Byington, PA 03/23/15 [phone]

## 2015-04-14 ENCOUNTER — Ambulatory Visit: Payer: Self-pay | Admitting: Family Medicine

## 2015-06-14 MED FILL — MELOXICAM 15 MG TABLET: 15 | 30 days supply | Qty: 30 | Fill #1

## 2015-06-20 DIAGNOSIS — Z1231 Encounter for screening mammogram for malignant neoplasm of breast: Secondary | ICD-10-CM | POA: Diagnosis not present

## 2015-06-20 DIAGNOSIS — Z01419 Encounter for gynecological examination (general) (routine) without abnormal findings: Secondary | ICD-10-CM | POA: Diagnosis not present

## 2015-06-27 DIAGNOSIS — R87610 Atypical squamous cells of undetermined significance on cytologic smear of cervix (ASC-US): Secondary | ICD-10-CM | POA: Diagnosis not present

## 2015-06-27 DIAGNOSIS — Z1151 Encounter for screening for human papillomavirus (HPV): Secondary | ICD-10-CM | POA: Diagnosis not present

## 2015-06-27 DIAGNOSIS — Z124 Encounter for screening for malignant neoplasm of cervix: Secondary | ICD-10-CM | POA: Diagnosis not present

## 2015-06-27 DIAGNOSIS — N924 Excessive bleeding in the premenopausal period: Secondary | ICD-10-CM | POA: Diagnosis not present

## 2015-06-29 MED FILL — NORETHINDRONE 0.35 MG TAB: 0.35 | 28 days supply | Qty: 28 | Fill #0

## 2015-08-12 MED FILL — NORETHINDRONE 0.35 MG TAB: 0.35 | 84 days supply | Qty: 84 | Fill #1

## 2015-09-01 DIAGNOSIS — J45909 Unspecified asthma, uncomplicated: Secondary | ICD-10-CM | POA: Diagnosis not present

## 2015-09-01 DIAGNOSIS — J45901 Unspecified asthma with (acute) exacerbation: Secondary | ICD-10-CM | POA: Diagnosis not present

## 2015-09-01 MED FILL — VENTOLIN HFA 90 MCG INHALER: 108 (90 BAS | 16 days supply | Qty: 18 | Fill #0

## 2015-09-01 MED FILL — MONTELUKAST SOD 10 MG TAB: 10 | 30 days supply | Qty: 30 | Fill #0

## 2015-09-05 MED FILL — SYMBICORT 80-4.5 MCG INH: 80-4.5 | 30 days supply | Qty: 10 | Fill #0

## 2015-09-05 MED FILL — MELOXICAM 15 MG TABLET: 15 | 30 days supply | Qty: 30 | Fill #2

## 2015-10-25 MED FILL — MONTELUKAST SOD 10 MG TAB: 10 | 30 days supply | Qty: 30 | Fill #1

## 2015-10-25 MED FILL — NORETHINDRONE 0.35 MG TAB: 0.35 | 84 days supply | Qty: 84 | Fill #2

## 2015-12-29 DIAGNOSIS — Z6839 Body mass index (BMI) 39.0-39.9, adult: Secondary | ICD-10-CM | POA: Diagnosis not present

## 2015-12-29 DIAGNOSIS — R7309 Other abnormal glucose: Secondary | ICD-10-CM | POA: Diagnosis not present

## 2015-12-29 DIAGNOSIS — J45909 Unspecified asthma, uncomplicated: Secondary | ICD-10-CM | POA: Diagnosis not present

## 2015-12-29 DIAGNOSIS — E669 Obesity, unspecified: Secondary | ICD-10-CM | POA: Diagnosis not present

## 2015-12-29 DIAGNOSIS — Z0000 Encounter for general adult medical examination without abnormal findings: Secondary | ICD-10-CM | POA: Diagnosis not present

## 2015-12-29 MED FILL — MONTELUKAST SOD 10 MG TAB: 10 | 90 days supply | Qty: 90 | Fill #0

## 2015-12-29 MED FILL — SYMBICORT 80-4.5 MCG INH: 80-4.5 | 84 days supply | Qty: 31 | Fill #0

## 2015-12-30 DIAGNOSIS — Z6839 Body mass index (BMI) 39.0-39.9, adult: Secondary | ICD-10-CM | POA: Diagnosis not present

## 2015-12-30 DIAGNOSIS — R7309 Other abnormal glucose: Secondary | ICD-10-CM | POA: Diagnosis not present

## 2015-12-30 DIAGNOSIS — E669 Obesity, unspecified: Secondary | ICD-10-CM | POA: Diagnosis not present

## 2016-01-17 MED FILL — ZEGERID OTC 20-1,100 MG CAP: 20-1100 | 42 days supply | Qty: 42 | Fill #1

## 2016-02-03 MED FILL — HEATHER TABLET: 0.35 | 84 days supply | Qty: 84 | Fill #3

## 2016-04-12 MED FILL — ZEGERID OTC 20-1,100 MG CAP: 20-1100 | 42 days supply | Qty: 42 | Fill #0

## 2016-04-13 MED FILL — MONTELUKAST SOD 10 MG TAB: 10 | 90 days supply | Qty: 90 | Fill #1

## 2016-05-11 MED FILL — MELOXICAM 15 MG TABLET: 15 | 30 days supply | Qty: 30 | Fill #0

## 2016-05-25 MED FILL — HEATHER TABLET: 0.35 | 84 days supply | Qty: 84 | Fill #4

## 2016-06-14 MED FILL — SYMBICORT 80-4.5 MCG INH: 80-4.5 | 84 days supply | Qty: 31 | Fill #1

## 2016-06-14 MED FILL — ZEGERID OTC 20-1,100 MG CAP: 20-1100 | 42 days supply | Qty: 42 | Fill #1

## 2016-06-28 DIAGNOSIS — S0502XA Injury of conjunctiva and corneal abrasion without foreign body, left eye, initial encounter: Secondary | ICD-10-CM | POA: Diagnosis not present

## 2016-06-29 DIAGNOSIS — S0501XD Injury of conjunctiva and corneal abrasion without foreign body, right eye, subsequent encounter: Secondary | ICD-10-CM | POA: Diagnosis not present

## 2016-07-02 DIAGNOSIS — S0502XD Injury of conjunctiva and corneal abrasion without foreign body, left eye, subsequent encounter: Secondary | ICD-10-CM | POA: Diagnosis not present

## 2016-08-01 DIAGNOSIS — M79673 Pain in unspecified foot: Secondary | ICD-10-CM | POA: Diagnosis not present

## 2016-08-01 DIAGNOSIS — M79602 Pain in left arm: Secondary | ICD-10-CM | POA: Diagnosis not present

## 2016-08-01 DIAGNOSIS — R7309 Other abnormal glucose: Secondary | ICD-10-CM | POA: Diagnosis not present

## 2016-08-01 DIAGNOSIS — J45909 Unspecified asthma, uncomplicated: Secondary | ICD-10-CM | POA: Diagnosis not present

## 2016-08-01 DIAGNOSIS — E782 Mixed hyperlipidemia: Secondary | ICD-10-CM | POA: Diagnosis not present

## 2016-08-01 DIAGNOSIS — R079 Chest pain, unspecified: Secondary | ICD-10-CM | POA: Diagnosis not present

## 2016-08-07 DIAGNOSIS — Z6841 Body Mass Index (BMI) 40.0 and over, adult: Secondary | ICD-10-CM | POA: Diagnosis not present

## 2016-08-07 DIAGNOSIS — Z01419 Encounter for gynecological examination (general) (routine) without abnormal findings: Secondary | ICD-10-CM | POA: Diagnosis not present

## 2016-08-07 DIAGNOSIS — Z1151 Encounter for screening for human papillomavirus (HPV): Secondary | ICD-10-CM | POA: Diagnosis not present

## 2016-08-07 DIAGNOSIS — Z1231 Encounter for screening mammogram for malignant neoplasm of breast: Secondary | ICD-10-CM | POA: Diagnosis not present

## 2016-08-20 ENCOUNTER — Ambulatory Visit (INDEPENDENT_AMBULATORY_CARE_PROVIDER_SITE_OTHER): Payer: 59 | Admitting: Cardiology

## 2016-08-20 ENCOUNTER — Encounter (INDEPENDENT_AMBULATORY_CARE_PROVIDER_SITE_OTHER): Payer: Self-pay

## 2016-08-20 ENCOUNTER — Encounter: Payer: Self-pay | Admitting: Cardiology

## 2016-08-20 VITALS — BP 132/72 | HR 67 | Ht 60.0 in | Wt 208.2 lb

## 2016-08-20 DIAGNOSIS — R9431 Abnormal electrocardiogram [ECG] [EKG]: Secondary | ICD-10-CM

## 2016-08-20 DIAGNOSIS — I739 Peripheral vascular disease, unspecified: Secondary | ICD-10-CM | POA: Diagnosis not present

## 2016-08-20 DIAGNOSIS — R011 Cardiac murmur, unspecified: Secondary | ICD-10-CM

## 2016-08-20 DIAGNOSIS — R0600 Dyspnea, unspecified: Secondary | ICD-10-CM | POA: Diagnosis not present

## 2016-08-20 NOTE — Patient Instructions (Signed)
Medication Instructions:  The current medical regimen is effective;  continue present plan and medications.  Testing/Procedures: Your physician has requested that you have an echocardiogram. Echocardiography is a painless test that uses sound waves to create images of your heart. It provides your doctor with information about the size and shape of your heart and how well your heart's chambers and valves are working. This procedure takes approximately one hour. There are no restrictions for this procedure.  Your physician has requested that you have a lower extremity arterial exercise duplex. During this test, exercise and ultrasound are used to evaluate arterial blood flow in the legs. Allow one hour for this exam. There are no restrictions or special instructions.  Follow-Up: Follow up as needed with Dr Anne Fu  If you need a refill on your cardiac medications before your next appointment, please call your pharmacy.  Thank you for choosing [facility]!!

## 2016-08-20 NOTE — Progress Notes (Signed)
Cardiology Office Note    Date:  08/20/2016   ID:  Paula Sparks, DOB Feb 26, 1966, MRN [id number]  PCP:  Arnette Felts  Cardiologist:   Donato Schultz, MD     History of Present Illness:  Paula Sparks is a 51 y.o. female here for evaluation of foot pain, claudication at the request of Antoine Primas, D.O.  She previously had a fracture of her foot back in 2011 and pain hurts when she is sitting for a long period of time. Sometimes she feels soreness with walking. Different shoes do not help. Anti-inflammatories sometimes are helpful. Walking seems to elevated though. Pain is 6/10  Mild dyspnea with asthmna. CK was a little high.   Past Medical History:  Diagnosis Date  . Cough   . GERD (gastroesophageal reflux disease)   . Hyperlipidemia   . Murmur, heart     Past Surgical History:  Procedure Laterality Date  . BREAST LUMPECTOMY  2003  . CESAREAN SECTION    . NASAL SINUS SURGERY      Current Medications: Outpatient Medications Prior to Visit  Medication Sig Dispense Refill  . beclomethasone (QVAR) 80 MCG/ACT inhaler Inhale 2 puffs into the lungs 2 (two) times daily. 1 Inhaler 2  . cetirizine (ZYRTEC) 10 MG tablet Take 10 mg by mouth daily.    . chlorpheniramine (CHLOR-TRIMETON) 4 MG tablet Take 1-2 tablets at night 30 tablet 0  . fish oil-omega-3 fatty acids 1000 MG capsule Take 2 g by mouth daily.      . fluticasone (FLONASE) 50 MCG/ACT nasal spray Place 2 sprays into the nose daily. 16 g 2  . Ibuprofen-Famotidine 800-26.6 MG TABS Take 1 tablet 3 times daily as needed. 270 tablet 3  . ipratropium (ATROVENT) 0.06 % nasal spray Place 2 sprays into both nostrils 4 (four) times daily. 15 mL 1  . montelukast (SINGULAIR) 10 MG tablet Take 10 mg by mouth at bedtime.    . Multiple Vitamins-Minerals (MULTIVITAMIN WITH MINERALS) tablet Take 1 tablet by mouth daily.      Maxwell Caul Bicarbonate (ZEGERID OTC) 20-1100 MG CAPS Take 1 capsule on a empty  stomach 30 each 2  . amoxicillin (AMOXIL) 500 MG capsule Take 1 capsule (500 mg total) by mouth 3 (three) times daily. 21 capsule 0  . Diclofenac Sodium 2 % SOLN Apply 1 pump twice daily as needed. 112 g 3  . metFORMIN (GLUCOPHAGE) 500 MG tablet Take by mouth 2 (two) times daily with a meal.     No facility-administered medications prior to visit.      Allergies:   Patient has no known allergies.   Social History   Social History  . Marital status: Married    Spouse name: N/A  . Number of children: N/A  . Years of education: N/A   Occupational History  . RN [employer]   Social History Main Topics  . Smoking status: Never Smoker  . Smokeless tobacco: Never Used  . Alcohol use No  . Drug use: No  . Sexual activity: Not on file   Other Topics Concern  . Not on file   Social History Narrative  . No narrative on file     Family History:  The patient's No early family history of coronary artery disease   ROS:   Please see the history of present illness.    ROS All other systems reviewed and are negative.   PHYSICAL EXAM:   VS:  BP 132/72  Pulse 67   Ht 5' (1.524 m)   Wt 208 lb 4 oz (94.5 kg)   SpO2 98%   BMI 40.67 kg/m    GEN: Well nourished, well developed, in no acute distress  HEENT: normal  Neck: no JVD, carotid bruits, or masses Cardiac: RRR; no murmurs, rubs, or gallops,no edema  Respiratory:  clear to auscultation bilaterally, normal work of breathing GI: soft, nontender, nondistended, + BS MS: no deformity or atrophy , difficult to palpate distal left pedal pulses. Skin: warm and dry, no rash Neuro:  Alert and Oriented x 3, Strength and sensation are intact Psych: euthymic mood, full affect  Wt Readings from Last 3 Encounters:  08/20/16 208 lb 4 oz (94.5 kg)  03/14/15 199 lb (90.3 kg)  05/03/11 192 lb 6.4 oz (87.3 kg)      Studies/Labs Reviewed:   EKG:  EKG is ordered today.  The ekg ordered today demonstrates 08/20/16-sinus rhythm heart rate 67  bpm with left axis deviation, T-wave inversion in lead 3 and aVF.  Recent Labs: No results found for requested labs within last 8760 hours.   Lipid Panel No results found for: CHOL, TRIG, HDL, CHOLHDL, VLDL, LDLCALC, LDLDIRECT  Additional studies/ records that were reviewed today include:   Prior office notes, EKG, lab work reviewed   ASSESSMENT:    1. Claudication (HCC)   2. Murmur, cardiac   3. Abnormal EKG   4. Morbid obesity (HCC)   5. Dyspnea, unspecified type      PLAN:  In order of problems listed above:  Bilateral foot pain, left greater than right  - She is having some pain with walking, there was concern for possible vascular changes. We will check arterial Dopplers bilaterally.  Heart murmur  - Checking echocardiogram  Elevated CK  - If this remains elevated, you may wish to refer her to rheumatology. Could be secondary to muscular injury.  Abnormal EKG  - Sinus rhythm with left ventricular hypertrophy left axis deviation and nonspecific ST-T wave changes. I will check an echocardiogram to ensure that she does not have evidence of hypertrophic cardiomyopathy. No early family history of syncope or sudden death.  Morbid Obesity  - Encourage weight loss  Shortness of breath  - Occasionally because of asthma. Checking echocardiogram. Encouraged conditioning/weight loss. This is hard since she has had her foot injuries.    Medication Adjustments/Labs and Tests Ordered: Current medicines are reviewed at length with the patient today.  Concerns regarding medicines are outlined above.  Medication changes, Labs and Tests ordered today are listed in the Patient Instructions below. Patient Instructions  Medication Instructions:  The current medical regimen is effective;  continue present plan and medications.  Testing/Procedures: Your physician has requested that you have an echocardiogram. Echocardiography is a painless test that uses sound waves to create images  of your heart. It provides your doctor with information about the size and shape of your heart and how well your heart's chambers and valves are working. This procedure takes approximately one hour. There are no restrictions for this procedure.  Your physician has requested that you have a lower extremity arterial exercise duplex. During this test, exercise and ultrasound are used to evaluate arterial blood flow in the legs. Allow one hour for this exam. There are no restrictions or special instructions.  Follow-Up: Follow up as needed with Dr Anne Fu  If you need a refill on your cardiac medications before your next appointment, please call your pharmacy.  Thank you  for choosing [facility]!!        Signed, Donato Schultz, MD  08/20/2016 4:16 PM    [facility] [address] [place], [place], [place]  [address] Phone: [phone]; Fax: [phone]

## 2016-08-24 ENCOUNTER — Telehealth: Payer: Self-pay | Admitting: Cardiovascular Disease

## 2016-08-24 NOTE — Telephone Encounter (Signed)
08/24/2016 Received faxed referral packet from [facility] Dr. Anne Fu for upcoming appointment with Dr. Duke Salvia.  Records given to [facility].  cbr

## 2016-09-04 ENCOUNTER — Ambulatory Visit (HOSPITAL_COMMUNITY): Payer: 59 | Attending: Cardiovascular Disease

## 2016-09-04 ENCOUNTER — Other Ambulatory Visit: Payer: Self-pay

## 2016-09-04 DIAGNOSIS — I340 Nonrheumatic mitral (valve) insufficiency: Secondary | ICD-10-CM | POA: Diagnosis not present

## 2016-09-04 DIAGNOSIS — R011 Cardiac murmur, unspecified: Secondary | ICD-10-CM | POA: Insufficient documentation

## 2016-09-04 MED FILL — NORETHINDRONE 0.35 MG TAB: 0.35 | 84 days supply | Qty: 84 | Fill #0

## 2016-09-06 ENCOUNTER — Other Ambulatory Visit: Payer: Self-pay | Admitting: Cardiology

## 2016-09-06 DIAGNOSIS — I739 Peripheral vascular disease, unspecified: Secondary | ICD-10-CM

## 2016-09-07 DIAGNOSIS — H52223 Regular astigmatism, bilateral: Secondary | ICD-10-CM | POA: Diagnosis not present

## 2016-09-11 ENCOUNTER — Ambulatory Visit (HOSPITAL_COMMUNITY)
Admission: RE | Admit: 2016-09-11 | Discharge: 2016-09-11 | Disposition: A | Discharge status: Home / Self Care | Payer: 59 | Source: Ambulatory Visit | Attending: Internal Medicine | Admitting: Internal Medicine

## 2016-09-11 DIAGNOSIS — I739 Peripheral vascular disease, unspecified: Secondary | ICD-10-CM | POA: Diagnosis not present

## 2016-09-13 MED FILL — PANTOPRAZOLE SOD DR 40 MG T: 40 | 35 days supply | Qty: 35 | Fill #0

## 2016-09-13 MED FILL — AZITHROMYCIN 500 MG TABLET: 500 | 3 days supply | Qty: 3 | Fill #0

## 2016-09-13 MED FILL — ATOVAQUONE-PROGUANIL 250-10: 250-100 | 27 days supply | Qty: 27 | Fill #0

## 2016-09-14 MED FILL — MONTELUKAST SOD 10 MG TAB: 10 | 90 days supply | Qty: 90 | Fill #0

## 2016-11-08 MED FILL — ZEGERID OTC 20-1,100 MG CAP: 20-1100 | 28 days supply | Qty: 28 | Fill #0

## 2017-01-01 MED FILL — ZEGERID OTC 20-1,100 MG CAP: 20-1100 | 28 days supply | Qty: 28 | Fill #1

## 2017-01-01 MED FILL — NORETHINDRONE 0.35 MG TAB: 0.35 | 84 days supply | Qty: 84 | Fill #1

## 2017-01-24 MED FILL — MONTELUKAST SOD 10 MG TAB: 10 | 90 days supply | Qty: 90 | Fill #1

## 2017-05-06 DIAGNOSIS — E669 Obesity, unspecified: Secondary | ICD-10-CM | POA: Diagnosis not present

## 2017-05-06 DIAGNOSIS — Z6839 Body mass index (BMI) 39.0-39.9, adult: Secondary | ICD-10-CM | POA: Diagnosis not present

## 2017-05-06 DIAGNOSIS — Z0000 Encounter for general adult medical examination without abnormal findings: Secondary | ICD-10-CM | POA: Diagnosis not present

## 2017-05-06 DIAGNOSIS — J45909 Unspecified asthma, uncomplicated: Secondary | ICD-10-CM | POA: Diagnosis not present

## 2017-05-06 MED FILL — CETIRIZINE HCL 10 MG TABS: 10 | 90 days supply | Qty: 90 | Fill #0

## 2017-05-06 MED FILL — SYMBICORT 80-4.5 MCG INH: 80-4.5 | 90 days supply | Qty: 31 | Fill #0

## 2017-05-06 MED FILL — ZEGERID 20MG CAPSULE: 20 MG | 84 days supply | Qty: 84 | Fill #0

## 2017-05-06 MED FILL — MONTELUKAST SOD 10 MG TAB: 10 | 90 days supply | Qty: 90 | Fill #0

## 2017-05-10 MED FILL — NORETHINDRONE 0.35 MG TAB: 0.35 | 84 days supply | Qty: 84 | Fill #2

## 2017-05-27 ENCOUNTER — Other Ambulatory Visit: Payer: Self-pay

## 2017-05-27 ENCOUNTER — Ambulatory Visit (INDEPENDENT_AMBULATORY_CARE_PROVIDER_SITE_OTHER): Payer: 59

## 2017-05-27 ENCOUNTER — Ambulatory Visit (HOSPITAL_COMMUNITY)
Admission: EM | Admit: 2017-05-27 | Discharge: 2017-05-27 | Disposition: A | Discharge status: Home / Self Care | Payer: 59 | Attending: Family Medicine | Admitting: Family Medicine

## 2017-05-27 ENCOUNTER — Encounter (HOSPITAL_COMMUNITY): Payer: Self-pay | Admitting: Emergency Medicine

## 2017-05-27 DIAGNOSIS — J40 Bronchitis, not specified as acute or chronic: Secondary | ICD-10-CM

## 2017-05-27 DIAGNOSIS — R05 Cough: Secondary | ICD-10-CM | POA: Diagnosis not present

## 2017-05-27 MED ORDER — AZITHROMYCIN 250 MG PO TABS: 250.0000 mg | ORAL_TABLET | Freq: Every day | ORAL | 0 refills | Status: DC

## 2017-05-27 MED ORDER — BENZONATATE 100 MG PO CAPS: 100.0000 mg | ORAL_CAPSULE | Freq: Two times a day (BID) | ORAL | 0 refills | Status: DC | PRN

## 2017-05-27 MED FILL — BENZONATATE 100 MG CAPS: 100 | 5 days supply | Qty: 20 | Fill #0

## 2017-05-27 MED FILL — AZITHROMYCIN 250 MG TABLET: 250 | 5 days supply | Qty: 6 | Fill #0

## 2017-05-27 NOTE — ED Triage Notes (Signed)
Pt c/o cough x 1 week.

## 2017-05-27 NOTE — ED Provider Notes (Signed)
[facility]   [id number] 05/27/17 Arrival Time: 1526   SUBJECTIVE:  Paula Sparks is a 52 y.o. female who presents to the urgent care with complaint of cough x1 week.   She has had a history of asthma and is taking her Symbicort as directed.  She saw her primary care doctor recently.  She has had no shortness of breath but has noticed increasing phlegm which is now yellow.  No gi symptoms. Past Medical History:  Diagnosis Date  . Cough   . GERD (gastroesophageal reflux disease)   . Hyperlipidemia   . Murmur, heart    Family History  Problem Relation Age of Onset  . Heart disease Neg Hx    Social History   Socioeconomic History  . Marital status: Married    Spouse name: Not on file  . Number of children: Not on file  . Years of education: Not on file  . Highest education level: Not on file  Social Needs  . Financial resource strain: Not on file  . Food insecurity - worry: Not on file  . Food insecurity - inability: Not on file  . Transportation needs - medical: Not on file  . Transportation needs - non-medical: Not on file  Occupational History  . Occupation: Teacher, adult educationN    Employer: [employer]  Tobacco Use  . Smoking status: Never Smoker  . Smokeless tobacco: Never Used  Substance and Sexual Activity  . Alcohol use: No  . Drug use: No  . Sexual activity: Not on file  Other Topics Concern  . Not on file  Social History Narrative  . Not on file   Current Meds  Medication Sig  . aspirin EC 81 MG tablet Take 81 mg by mouth daily.  . montelukast (SINGULAIR) 10 MG tablet Take 10 mg by mouth at bedtime.   No Known Allergies    ROS: As per HPI, remainder of ROS negative.   OBJECTIVE:   Vitals:   05/27/17 1551  BP: 131/73  Pulse: 72  Resp: 18  Temp: 98.9 F (37.2 C)  SpO2: 100%     General appearance: alert; no distress Eyes: PERRL; EOMI; conjunctiva normal HENT: normocephalic; atraumatic; ; nasal mucosa normal; oral mucosa  normal Neck: supple; no adenopathy Lungs: clear to auscultation bilaterally Heart: regular rate and rhythm Back: no CVA tenderness Extremities: no cyanosis or edema; symmetrical with no gross deformities Skin: warm and dry Neurologic: normal gait; grossly normal Psychological: alert and cooperative; normal mood and affect      Labs:  Results for orders placed or performed during the hospital encounter of 03/02/11  Pregnancy, urine POC  Result Value Ref Range   Preg Test, Ur NEGATIVE     Labs Reviewed - No data to display  Dg Chest 2 View  Result Date: 05/27/2017 CLINICAL DATA:  Cough x1 week EXAM: CHEST  2 VIEW COMPARISON:  03/21/2015 FINDINGS: Lungs are clear.  No pleural effusion or pneumothorax. The heart is normal in size. Mild degenerative changes of the visualized thoracolumbar spine. IMPRESSION: Normal chest radiographs. Electronically Signed   By: Charline BillsSriyesh  Krishnan M.D.   On: 05/27/2017 16:55       ASSESSMENT & PLAN:  1. Bronchitis     Meds ordered this encounter  Medications  . azithromycin (ZITHROMAX) 250 MG tablet    Sig: Take 1 tablet (250 mg total) by mouth daily. Take first 2 tablets together, then 1 every day until finished.    Dispense:  6 tablet  Refill:  0  . benzonatate (TESSALON) 100 MG capsule    Sig: Take 1-2 capsules (100-200 mg total) by mouth 2 (two) times daily as needed for cough.    Dispense:  20 capsule    Refill:  0    Reviewed expectations re: course of current medical issues. Questions answered. Outlined signs and symptoms indicating need for more acute intervention. Patient verbalized understanding. After Visit Summary given.    Procedures:      Elvina Sidle, MD 05/27/17 1659

## 2017-05-27 NOTE — Discharge Instructions (Signed)
Your chest x-ray shows no pneumonia and is consistent with bronchitis.

## 2017-08-09 MED FILL — NORETHINDRONE 0.35 MG TAB: 0.35 | 84 days supply | Qty: 84 | Fill #3

## 2017-09-06 MED FILL — MONTELUKAST SOD 10 MG TAB: 10 | 90 days supply | Qty: 90 | Fill #1

## 2017-09-11 MED FILL — ZEGERID 20MG CAPSULE: 20 MG | 84 days supply | Qty: 84 | Fill #1

## 2018-01-21 MED FILL — MONTELUKAST SOD 10 MG TAB: 10 | 60 days supply | Qty: 60 | Fill #0

## 2018-01-21 MED FILL — SYMBICORT 80-4.5 MCG INH: 80-4.5 | 90 days supply | Qty: 31 | Fill #1

## 2018-04-17 DIAGNOSIS — H524 Presbyopia: Secondary | ICD-10-CM | POA: Diagnosis not present

## 2018-05-08 DIAGNOSIS — Z1329 Encounter for screening for other suspected endocrine disorder: Secondary | ICD-10-CM | POA: Diagnosis not present

## 2018-05-08 DIAGNOSIS — Z1151 Encounter for screening for human papillomavirus (HPV): Secondary | ICD-10-CM | POA: Diagnosis not present

## 2018-05-08 DIAGNOSIS — Z1231 Encounter for screening mammogram for malignant neoplasm of breast: Secondary | ICD-10-CM | POA: Diagnosis not present

## 2018-05-08 DIAGNOSIS — Z0000 Encounter for general adult medical examination without abnormal findings: Secondary | ICD-10-CM | POA: Diagnosis not present

## 2018-05-08 DIAGNOSIS — Z6839 Body mass index (BMI) 39.0-39.9, adult: Secondary | ICD-10-CM | POA: Diagnosis not present

## 2018-05-08 DIAGNOSIS — Z130 Encounter for screening for diseases of the blood and blood-forming organs and certain disorders involving the immune mechanism: Secondary | ICD-10-CM | POA: Diagnosis not present

## 2018-05-08 DIAGNOSIS — Z131 Encounter for screening for diabetes mellitus: Secondary | ICD-10-CM | POA: Diagnosis not present

## 2018-05-08 DIAGNOSIS — Z13220 Encounter for screening for lipoid disorders: Secondary | ICD-10-CM | POA: Diagnosis not present

## 2018-05-08 DIAGNOSIS — Z01419 Encounter for gynecological examination (general) (routine) without abnormal findings: Secondary | ICD-10-CM | POA: Diagnosis not present

## 2018-05-08 LAB — HM MAMMOGRAPHY: HM Mammogram: NORMAL (ref 0–4)

## 2018-05-08 LAB — HM PAP SMEAR: HM Pap smear: NORMAL

## 2018-05-12 ENCOUNTER — Encounter: Payer: Self-pay | Admitting: Nurse Practitioner

## 2018-05-16 DIAGNOSIS — F902 Attention-deficit hyperactivity disorder, combined type: Secondary | ICD-10-CM | POA: Diagnosis not present

## 2018-05-27 DIAGNOSIS — N924 Excessive bleeding in the premenopausal period: Secondary | ICD-10-CM | POA: Diagnosis not present

## 2018-05-27 DIAGNOSIS — N950 Postmenopausal bleeding: Secondary | ICD-10-CM | POA: Diagnosis not present

## 2018-06-02 ENCOUNTER — Other Ambulatory Visit: Payer: Self-pay | Admitting: Nurse Practitioner

## 2018-06-02 MED FILL — NORLYDA 0.35 MG TABS: 0.35 | 28 days supply | Qty: 28 | Fill #0

## 2018-06-04 MED FILL — MONTELUKAST SOD 10 MG TAB: 10 | 30 days supply | Qty: 30 | Fill #0

## 2018-07-03 MED FILL — MONTELUKAST SOD 10 MG TAB: 10 | 30 days supply | Qty: 30 | Fill #1

## 2018-07-03 MED FILL — NORLYDA 0.35 MG TABS: 0.35 | 28 days supply | Qty: 28 | Fill #1

## 2018-07-31 MED FILL — NORLYDA 0.35 MG TABS: 0.35 | 28 days supply | Qty: 28 | Fill #2 | Status: TO

## 2018-08-27 ENCOUNTER — Other Ambulatory Visit: Payer: Self-pay | Admitting: Nurse Practitioner

## 2018-08-28 MED FILL — MONTELUKAST SOD 10 MG TAB: 10 | 30 days supply | Qty: 30 | Fill #0

## 2018-08-29 MED FILL — NORETHINDRONE 0.35 MG TAB: 0.35 | 28 days supply | Qty: 28 | Fill #0

## 2018-09-24 MED FILL — MONTELUKAST SOD 10 MG TAB: 10 | 30 days supply | Qty: 30 | Fill #1

## 2018-09-25 MED FILL — NORETHINDRONE 0.35 MG TAB: 0.35 | 28 days supply | Qty: 28 | Fill #1

## 2018-10-24 ENCOUNTER — Other Ambulatory Visit: Payer: Self-pay | Admitting: Nurse Practitioner

## 2018-10-24 MED FILL — NORETHINDRONE 0.35 MG TAB: 0.35 | 28 days supply | Qty: 28 | Fill #2

## 2018-10-29 ENCOUNTER — Telehealth: Payer: Self-pay

## 2018-10-29 NOTE — Telephone Encounter (Signed)
I left patient a v/m to call the office so I can know if she is still a patient here we have received a refill request on singulair YRL,RMA

## 2018-11-28 MED FILL — NORETHINDRONE 0.35 MG TAB: 0.35 | 28 days supply | Qty: 28 | Fill #0

## 2018-12-11 ENCOUNTER — Ambulatory Visit: Payer: Federal, State, Local not specified - PPO | Admitting: Nurse Practitioner

## 2018-12-11 ENCOUNTER — Encounter: Payer: Self-pay | Admitting: Nurse Practitioner

## 2018-12-11 ENCOUNTER — Other Ambulatory Visit: Payer: Self-pay

## 2018-12-11 VITALS — BP 116/72 | HR 59 | Temp 97.5°F | Ht 60.0 in | Wt 207.4 lb

## 2018-12-11 DIAGNOSIS — Z0000 Encounter for general adult medical examination without abnormal findings: Secondary | ICD-10-CM

## 2018-12-11 DIAGNOSIS — R7303 Prediabetes: Secondary | ICD-10-CM

## 2018-12-11 DIAGNOSIS — Z23 Encounter for immunization: Secondary | ICD-10-CM

## 2018-12-11 LAB — POCT URINALYSIS DIPSTICK
Bilirubin, UA: NEGATIVE
Blood, UA: NEGATIVE
Glucose, UA: NEGATIVE
Ketones, UA: NEGATIVE
Leukocytes, UA: NEGATIVE
Nitrite, UA: NEGATIVE
Protein, UA: NEGATIVE
Spec Grav, UA: >=1.03 — AB (ref 1.010–1.025)
Urobilinogen, UA: 0.2 E.U./dL
pH, UA: 5 (ref 5.0–8.0)

## 2018-12-11 MED ORDER — MONTELUKAST SODIUM 10 MG PO TABS: 10.0000 mg | ORAL_TABLET | Freq: Every day | ORAL | 1 refills | Status: DC

## 2018-12-11 MED ORDER — ZOSTER VAC RECOMB ADJUVANTED 50 MCG/0.5ML IM SUSR: 0.5000 mL | Freq: Once | INTRAMUSCULAR | 0 refills | Status: AC

## 2018-12-11 MED ORDER — ALBUTEROL SULFATE HFA 108 (90 BASE) MCG/ACT IN AERS: 2.0000 | INHALATION_SPRAY | Freq: Four times a day (QID) | RESPIRATORY_TRACT | 1 refills | Status: DC | PRN

## 2018-12-11 MED ORDER — BUDESONIDE-FORMOTEROL FUMARATE 160-4.5 MCG/ACT IN AERO: 2.0000 | INHALATION_SPRAY | Freq: Two times a day (BID) | RESPIRATORY_TRACT | 6 refills | Status: DC

## 2018-12-11 MED FILL — SYMBICORT 160-4.5 MCG INH: 160-4.5 | 30 days supply | Qty: 10 | Fill #0

## 2018-12-11 MED FILL — MONTELUKAST SOD 10 MG TAB: 10 | 90 days supply | Qty: 90 | Fill #0

## 2018-12-11 MED FILL — ALBUTEROL SULFATE HFA 108 (: 108 (90 BAS | 25 days supply | Qty: 18 | Fill #0

## 2018-12-11 NOTE — Progress Notes (Signed)
Subjective:     Patient ID: Paula Sparks , female    DOB: 10-Jan-1966 , 53 y.o.   MRN: [id number]   Chief Complaint  Patient presents with  . Annual Exam   The patient states she uses post menopausal status for birth control. Last LMP was Patient's last menstrual period was 03/04/2015.. Negative for Dysmenorrhea and Negative for Menorrhagia Mammogram last done January 2020 and PAP by Dr. Garwin Brothers.  Negative for: breast discharge, breast lump(s), breast pain and breast self exam.  Pertinent negatives include abnormal bleeding (hematology), anxiety, decreased libido, depression, difficulty falling sleep, dyspareunia, history of infertility, nocturia, sexual dysfunction, sleep disturbances, urinary incontinence, urinary urgency, vaginal discharge and vaginal itching. Diet regular.The patient states her exercise level is      The patient's tobacco use is:  Social History   Tobacco Use  Smoking Status Never Smoker  Smokeless Tobacco Never Used  . She has been exposed to passive smoke. The patient's alcohol use is:  Social History   Substance and Sexual Activity  Alcohol Use No    HPI  Here for HM  She is concerned about her weight.    Wt Readings from Last 3 Encounters: 12/11/18 : 207 lb 6.4 oz (94.1 kg) 08/20/16 : 208 lb 4 oz (94.5 kg) 03/14/15 : 199 lb (90.3 kg)    Diabetes She presents for her follow-up diabetic visit. Diabetes type: prediabetes. Her disease course has been stable. There are no hypoglycemic associated symptoms. There are no diabetic associated symptoms. There are no hypoglycemic complications. Symptoms are stable. There are no diabetic complications. Risk factors for coronary artery disease include obesity and sedentary lifestyle. When asked about current treatments, none were reported.     Past Medical History:  Diagnosis Date  . Cough   . GERD (gastroesophageal reflux disease)   . Hyperlipidemia   . Murmur, heart      Family History   Problem Relation Age of Onset  . Heart disease Neg Hx      Current Outpatient Medications:  .  aspirin EC 81 MG tablet, Take 81 mg by mouth daily., Disp: , Rfl:  .  cetirizine (ZYRTEC) 10 MG tablet, Take 10 mg by mouth daily., Disp: , Rfl:  .  fish oil-omega-3 fatty acids 1000 MG capsule, Take 2 g by mouth daily.  , Disp: , Rfl:  .  Ibuprofen-Famotidine 800-26.6 MG TABS, Take 1 tablet 3 times daily as needed., Disp: 270 tablet, Rfl: 3 .  ipratropium (ATROVENT) 0.06 % nasal spray, Place 2 sprays into both nostrils 4 (four) times daily., Disp: 15 mL, Rfl: 1 .  montelukast (SINGULAIR) 10 MG tablet, TAKE 1 TABLET BY MOUTH EVERY EVENING, Disp: 30 tablet, Rfl: 1 .  Multiple Vitamins-Minerals (MULTIVITAMIN WITH MINERALS) tablet, Take 1 tablet by mouth daily.  , Disp: , Rfl:  .  Omeprazole-Sodium Bicarbonate (ZEGERID OTC) 20-1100 MG CAPS, Take 1 capsule on a empty stomach, Disp: 30 each, Rfl: 2 .  beclomethasone (QVAR) 80 MCG/ACT inhaler, Inhale 2 puffs into the lungs 2 (two) times daily., Disp: 1 Inhaler, Rfl: 2 .  fluticasone (FLONASE) 50 MCG/ACT nasal spray, Place 2 sprays into the nose daily., Disp: 16 g, Rfl: 2   No Known Allergies   Review of Systems  Constitutional: Negative.   HENT: Negative.   Eyes: Negative.   Respiratory: Negative.   Cardiovascular: Negative.   Gastrointestinal: Negative.   Endocrine: Negative.   Genitourinary: Negative.   Musculoskeletal: Negative.   Skin: Negative.  Allergic/Immunologic: Negative.   Neurological: Negative.   Hematological: Negative.   Psychiatric/Behavioral: Negative.      Today's Vitals   12/11/18 1559  BP: 116/72  Pulse: (!) 59  Temp: (!) 97.5 F (36.4 C)  TempSrc: Oral  Weight: 207 lb 6.4 oz (94.1 kg)  Height: 5' (1.524 m)  PainSc: 0-No pain   Body mass index is 40.51 kg/m.   Objective:  Physical Exam Constitutional:      Appearance: Normal appearance. She is well-developed.  HENT:     Head: Normocephalic and  atraumatic.     Right Ear: Hearing, tympanic membrane, ear canal and external ear normal.     Left Ear: Hearing, tympanic membrane, ear canal and external ear normal.     Nose: Nose normal.     Mouth/Throat:     Mouth: Mucous membranes are moist.  Eyes:     General: Lids are normal.     Conjunctiva/sclera: Conjunctivae normal.     Pupils: Pupils are equal, round, and reactive to light.     Funduscopic exam:    Right eye: No papilledema.        Left eye: No papilledema.  Neck:     Musculoskeletal: Full passive range of motion without pain, normal range of motion and neck supple.     Thyroid: No thyroid mass.     Vascular: No carotid bruit.  Cardiovascular:     Rate and Rhythm: Normal rate and regular rhythm.     Pulses: Normal pulses.     Heart sounds: Normal heart sounds. No murmur.  Pulmonary:     Effort: Pulmonary effort is normal.     Breath sounds: Normal breath sounds.  Chest:     Breasts: Breasts are symmetrical.        Right: Normal. No mass, nipple discharge or tenderness.        Left: Normal. No mass, nipple discharge or tenderness.  Abdominal:     General: Abdomen is flat. Bowel sounds are normal.     Palpations: Abdomen is soft.  Musculoskeletal: Normal range of motion.        General: No swelling.     Right lower leg: No edema.     Left lower leg: No edema.  Skin:    General: Skin is warm and dry.     Capillary Refill: Capillary refill takes less than 2 seconds.  Neurological:     General: No focal deficit present.     Mental Status: She is alert and oriented to person, place, and time.     Cranial Nerves: No cranial nerve deficit.     Sensory: No sensory deficit.  Psychiatric:        Mood and Affect: Mood normal.        Behavior: Behavior normal.        Thought Content: Thought content normal.        Judgment: Judgment normal.         Assessment And Plan:     1. Encounter for general adult medical examination w/o abnormal findings . Behavior  modifications discussed and diet history reviewed.   . Pt will continue to exercise regularly and modify diet with low GI, plant based foods and decrease intake of processed foods.  . Recommend intake of daily multivitamin, Vitamin D, and calcium.  . Recommend for preventive screenings, as well as recommend immunizations that include TDAP - POCT Urinalysis Dipstick (81002) - BMP8+Anion Gap - Lipid Profile - CBC no Diff -  Vitamin D (25 hydroxy)  2. Prediabetes  Chronic, HgbA1c was 6.1 in 2018  She has had steady weight gain since that time  Will consider ozempic once we receive her labs back  Discussed side effects of nausea and weight loss  Teaching given during visit. - CBC no Diff - Hemoglobin A1c - Pneumococcal polysaccharide vaccine 23-valent greater than or equal to 2yo subcutaneous/IM  3. Encounter for immunization  Sent Rx to pharmacy.  Pneumonia 23 vaccine given in office needs to wait at least a month before getting her shingrix - Zoster Vaccine Adjuvanted [facility](SHINGRIX) injection; Inject 0.5 mLs into the muscle once for 1 dose.  Dispense: 0.5 mL; Refill: 0   Paula FeltsJanece Rufino Staup, FNP    THE PATIENT IS ENCOURAGED TO PRACTICE SOCIAL DISTANCING DUE TO THE COVID-19 PANDEMIC.

## 2018-12-11 NOTE — Patient Instructions (Signed)
Health Maintenance, Female Adopting a healthy lifestyle and getting preventive care are important in promoting health and wellness. Ask your health care provider about:  The right schedule for you to have regular tests and exams.  Things you can do on your own to prevent diseases and keep yourself healthy. What should I know about diet, weight, and exercise? Eat a healthy diet   Eat a diet that includes plenty of vegetables, fruits, low-fat dairy products, and lean protein.  Do not eat a lot of foods that are high in solid fats, added sugars, or sodium. Maintain a healthy weight Body mass index (BMI) is used to identify weight problems. It estimates body fat based on height and weight. Your health care provider can help determine your BMI and help you achieve or maintain a healthy weight. Get regular exercise Get regular exercise. This is one of the most important things you can do for your health. Most adults should:  Exercise for at least 150 minutes each week. The exercise should increase your heart rate and make you sweat (moderate-intensity exercise).  Do strengthening exercises at least twice a week. This is in addition to the moderate-intensity exercise.  Spend less time sitting. Even light physical activity can be beneficial. Watch cholesterol and blood lipids Have your blood tested for lipids and cholesterol at 55 years of age, then have this test every 5 years. Have your cholesterol levels checked more often if:  Your lipid or cholesterol levels are high.  You are older than 55 years of age.  You are at high risk for heart disease. What should I know about cancer screening? Depending on your health history and family history, you may need to have cancer screening at various ages. This may include screening for:  Breast cancer.  Cervical cancer.  Colorectal cancer.  Skin cancer.  Lung cancer. What should I know about heart disease, diabetes, and high blood  pressure? Blood pressure and heart disease  High blood pressure causes heart disease and increases the risk of stroke. This is more likely to develop in people who have high blood pressure readings, are of African descent, or are overweight.  Have your blood pressure checked: ? Every 3-5 years if you are 18-39 years of age. ? Every year if you are 40 years old or older. Diabetes Have regular diabetes screenings. This checks your fasting blood sugar level. Have the screening done:  Once every three years after age 40 if you are at a normal weight and have a low risk for diabetes.  More often and at a younger age if you are overweight or have a high risk for diabetes. What should I know about preventing infection? Hepatitis B If you have a higher risk for hepatitis B, you should be screened for this virus. Talk with your health care provider to find out if you are at risk for hepatitis B infection. Hepatitis C Testing is recommended for:  Everyone born from 1945 through 1965.  Anyone with known risk factors for hepatitis C. Sexually transmitted infections (STIs)  Get screened for STIs, including gonorrhea and chlamydia, if: ? You are sexually active and are younger than 55 years of age. ? You are older than 55 years of age and your health care provider tells you that you are at risk for this type of infection. ? Your sexual activity has changed since you were last screened, and you are at increased risk for chlamydia or gonorrhea. Ask your health care provider if   you are at risk.  Ask your health care provider about whether you are at high risk for HIV. Your health care provider may recommend a prescription medicine to help prevent HIV infection. If you choose to take medicine to prevent HIV, you should first get tested for HIV. You should then be tested every 3 months for as long as you are taking the medicine. Pregnancy  If you are about to stop having your period (premenopausal) and  you may become pregnant, seek counseling before you get pregnant.  Take 400 to 800 micrograms (mcg) of folic acid every day if you become pregnant.  Ask for birth control (contraception) if you want to prevent pregnancy. Osteoporosis and menopause Osteoporosis is a disease in which the bones lose minerals and strength with aging. This can result in bone fractures. If you are 65 years old or older, or if you are at risk for osteoporosis and fractures, ask your health care provider if you should:  Be screened for bone loss.  Take a calcium or vitamin D supplement to lower your risk of fractures.  Be given hormone replacement therapy (HRT) to treat symptoms of menopause. Follow these instructions at home: Lifestyle  Do not use any products that contain nicotine or tobacco, such as cigarettes, e-cigarettes, and chewing tobacco. If you need help quitting, ask your health care provider.  Do not use street drugs.  Do not share needles.  Ask your health care provider for help if you need support or information about quitting drugs. Alcohol use  Do not drink alcohol if: ? Your health care provider tells you not to drink. ? You are pregnant, may be pregnant, or are planning to become pregnant.  If you drink alcohol: ? Limit how much you use to 0-1 drink a day. ? Limit intake if you are breastfeeding.  Be aware of how much alcohol is in your drink. In the [place] one drink equals one 12 oz bottle of beer (355 mL), one 5 oz glass of wine (148 mL), or one 1 oz glass of hard liquor (44 mL). General instructions  Schedule regular health, dental, and eye exams.  Stay current with your vaccines.  Tell your health care provider if: ? You often feel depressed. ? You have ever been abused or do not feel safe at home. Summary  Adopting a healthy lifestyle and getting preventive care are important in promoting health and wellness.  Follow your health care provider's instructions about healthy  diet, exercising, and getting tested or screened for diseases.  Follow your health care provider's instructions on monitoring your cholesterol and blood pressure. This information is not intended to replace advice given to you by your health care provider. Make sure you discuss any questions you have with your health care provider. Document Released: 11/13/2010 Document Revised: 04/23/2018 Document Reviewed: 04/23/2018 Elsevier Patient Education  2020 [employer]  

## 2018-12-12 LAB — BMP8+ANION GAP
Anion Gap: 15 mmol/L (ref 10.0–18.0)
BUN/Creatinine Ratio: 29 — ABNORMAL HIGH (ref 9–23)
BUN: 22 mg/dL (ref 6–24)
CO2: 23 mmol/L (ref 20–29)
Calcium: 9.8 mg/dL (ref 8.7–10.2)
Chloride: 104 mmol/L (ref 96–106)
Creatinine, Ser: 0.75 mg/dL (ref 0.57–1.00)
GFR calc Af Amer: 104 mL/min/{1.73_m2} (ref >59)
GFR calc non Af Amer: 90 mL/min/{1.73_m2} (ref >59)
Glucose: 98 mg/dL (ref 65–99)
Potassium: 4.8 mmol/L (ref 3.5–5.2)
Sodium: 142 mmol/L (ref 134–144)

## 2018-12-12 LAB — LIPID PANEL
Chol/HDL Ratio: 4 ratio (ref 0.0–4.4)
Cholesterol, Total: 202 mg/dL — ABNORMAL HIGH (ref 100–199)
HDL: 51 mg/dL (ref >39)
LDL Calculated: 137 mg/dL — ABNORMAL HIGH (ref 0–99)
Triglycerides: 71 mg/dL (ref 0–149)
VLDL Cholesterol Cal: 14 mg/dL (ref 5–40)

## 2018-12-12 LAB — VITAMIN D 25 HYDROXY (VIT D DEFICIENCY, FRACTURES): Vit D, 25-Hydroxy: 34.3 ng/mL (ref 30.0–100.0)

## 2018-12-12 LAB — CBC
Hematocrit: 40.5 % (ref 34.0–46.6)
Hemoglobin: 12.8 g/dL (ref 11.1–15.9)
MCH: 26.8 pg (ref 26.6–33.0)
MCHC: 31.6 g/dL (ref 31.5–35.7)
MCV: 85 fL (ref 79–97)
Platelets: 391 10*3/uL (ref 150–450)
RBC: 4.78 x10E6/uL (ref 3.77–5.28)
RDW: 14.2 % (ref 11.7–15.4)
WBC: 6.1 10*3/uL (ref 3.4–10.8)

## 2018-12-12 LAB — HEMOGLOBIN A1C
Est. average glucose Bld gHb Est-mCnc: 131 mg/dL
Hgb A1c MFr Bld: 6.2 % — ABNORMAL HIGH (ref 4.8–5.6)

## 2018-12-19 ENCOUNTER — Encounter: Payer: Self-pay | Admitting: Nurse Practitioner

## 2019-02-13 MED FILL — PNEUMOVAX 23 SYRINGE: 25 | 1 days supply | Qty: 1 | Fill #0

## 2019-02-19 ENCOUNTER — Encounter: Payer: Self-pay | Admitting: Nurse Practitioner

## 2019-02-25 ENCOUNTER — Ambulatory Visit: Payer: Federal, State, Local not specified - PPO | Admitting: Nurse Practitioner

## 2019-02-25 ENCOUNTER — Other Ambulatory Visit: Payer: Self-pay

## 2019-02-25 ENCOUNTER — Encounter: Payer: Self-pay | Admitting: Nurse Practitioner

## 2019-02-25 VITALS — BP 120/70 | HR 72 | Temp 98.0°F | Ht 59.6 in | Wt 212.2 lb

## 2019-02-25 DIAGNOSIS — Z113 Encounter for screening for infections with a predominantly sexual mode of transmission: Secondary | ICD-10-CM

## 2019-02-25 DIAGNOSIS — R7303 Prediabetes: Secondary | ICD-10-CM

## 2019-02-25 MED ORDER — OZEMPIC (0.25 OR 0.5 MG/DOSE) 2 MG/1.5ML ~~LOC~~ SOPN: 0.5000 mg | PEN_INJECTOR | SUBCUTANEOUS | 1 refills | Status: AC

## 2019-02-25 NOTE — Progress Notes (Signed)
Subjective:     Patient ID: Paula Sparks , female    DOB: 29-Jan-1966 , 53 y.o.   MRN: [id number]   Chief Complaint  Patient presents with  . prediabetes    HPI  Wt Readings from Last 3 Encounters: 02/25/19 : 212 lb 3.2 oz (96.3 kg) 12/11/18 : 207 lb 6.4 oz (94.1 kg) 08/20/16 : 208 lb 4 oz (94.5 kg)  When taking the Ozempic she noticed her weight decreased to below 200 but then she ran out.  She did begin walking and she feels it curved her appetite.     Past Medical History:  Diagnosis Date  . Cough   . GERD (gastroesophageal reflux disease)   . Hyperlipidemia   . Murmur, heart      Family History  Problem Relation Age of Onset  . Heart disease Neg Hx      Current Outpatient Medications:  .  albuterol (VENTOLIN HFA) 108 (90 Base) MCG/ACT inhaler, Inhale 2 puffs into the lungs every 6 (six) hours as needed for wheezing or shortness of breath., Disp: 18 g, Rfl: 1 .  aspirin EC 81 MG tablet, Take 81 mg by mouth daily., Disp: , Rfl:  .  budesonide-formoterol (SYMBICORT) 160-4.5 MCG/ACT inhaler, Inhale 2 puffs into the lungs 2 (two) times a day., Disp: 1 Inhaler, Rfl: 6 .  cetirizine (ZYRTEC) 10 MG tablet, Take 10 mg by mouth daily., Disp: , Rfl:  .  fish oil-omega-3 fatty acids 1000 MG capsule, Take 2 g by mouth daily.  , Disp: , Rfl:  .  montelukast (SINGULAIR) 10 MG tablet, Take 1 tablet (10 mg total) by mouth daily., Disp: 90 tablet, Rfl: 1 .  Multiple Vitamins-Minerals (MULTIVITAMIN WITH MINERALS) tablet, Take 1 tablet by mouth daily.  , Disp: , Rfl:  .  Omeprazole-Sodium Bicarbonate (ZEGERID OTC) 20-1100 MG CAPS, Take 1 capsule on a empty stomach, Disp: 30 each, Rfl: 2 .  fluticasone (FLONASE) 50 MCG/ACT nasal spray, Place 2 sprays into the nose daily., Disp: 16 g, Rfl: 2 .  Ibuprofen-Famotidine 800-26.6 MG TABS, Take 1 tablet 3 times daily as needed. (Patient not taking: Reported on 02/25/2019), Disp: 270 tablet, Rfl: 3 .  ipratropium (ATROVENT) 0.06 %  nasal spray, Place 2 sprays into both nostrils 4 (four) times daily. (Patient not taking: Reported on 02/25/2019), Disp: 15 mL, Rfl: 1 .  Semaglutide,0.25 or 0.5MG /DOS, (OZEMPIC, 0.25 OR 0.5 MG/DOSE,) 2 MG/1.5ML SOPN, Inject 0.5 mg into the skin once a week., Disp: 4.5 mL, Rfl: 1   No Known Allergies   Review of Systems  Constitutional: Negative.   Respiratory: Negative.   Cardiovascular: Negative.  Negative for chest pain, palpitations and leg swelling.  Neurological: Negative for dizziness and headaches.  Psychiatric/Behavioral: Negative.      Today's Vitals   02/25/19 1654  BP: 120/70  Pulse: 72  Temp: 98 F (36.7 C)  TempSrc: Oral  Weight: 212 lb 3.2 oz (96.3 kg)  Height: 4' 11.6" (1.514 m)  PainSc: 0-No pain   Body mass index is 42 kg/m.   Objective:  Physical Exam Constitutional:      Appearance: Normal appearance.  Cardiovascular:     Rate and Rhythm: Normal rate and regular rhythm.     Pulses: Normal pulses.     Heart sounds: Normal heart sounds. No murmur.  Pulmonary:     Effort: Pulmonary effort is normal. No respiratory distress.     Breath sounds: Normal breath sounds.  Skin:    General:  Skin is warm and dry.     Capillary Refill: Capillary refill takes less than 2 seconds.  Neurological:     General: No focal deficit present.     Mental Status: She is alert.         Assessment And Plan:     1. Prediabetes  Chronic  Will start ozempic once per week, she had been on Trulicity which has not been the most effective  Encouraged to increase physical activity to at least 150 minutes per week and eat a healthy diet - Semaglutide,0.25 or 0.5MG /DOS, (OZEMPIC, 0.25 OR 0.5 MG/DOSE,) 2 MG/1.5ML SOPN; Inject 0.5 mg into the skin once a week.  Dispense: 4.5 mL; Refill: 1     Arnette Felts, FNP    THE PATIENT IS ENCOURAGED TO PRACTICE SOCIAL DISTANCING DUE TO THE COVID-19 PANDEMIC.

## 2019-02-26 LAB — HIV ANTIBODY (ROUTINE TESTING W REFLEX): HIV Screen 4th Generation wRfx: NONREACTIVE

## 2019-02-26 LAB — BMP8+EGFR
BUN/Creatinine Ratio: 33 — ABNORMAL HIGH (ref 9–23)
BUN: 29 mg/dL — ABNORMAL HIGH (ref 6–24)
CO2: 26 mmol/L (ref 20–29)
Calcium: 9.4 mg/dL (ref 8.7–10.2)
Chloride: 104 mmol/L (ref 96–106)
Creatinine, Ser: 0.87 mg/dL (ref 0.57–1.00)
GFR calc Af Amer: 87 mL/min/{1.73_m2} (ref >59)
GFR calc non Af Amer: 75 mL/min/{1.73_m2} (ref >59)
Glucose: 101 mg/dL — ABNORMAL HIGH (ref 65–99)
Potassium: 4.6 mmol/L (ref 3.5–5.2)
Sodium: 141 mmol/L (ref 134–144)

## 2019-02-26 LAB — HEMOGLOBIN A1C
Est. average glucose Bld gHb Est-mCnc: 137 mg/dL
Hgb A1c MFr Bld: 6.4 % — ABNORMAL HIGH (ref 4.8–5.6)

## 2019-02-26 MED FILL — OZEMPIC 0.25 OR 0.5 MG/DOSE: 2 | 28 days supply | Qty: 2 | Fill #0

## 2019-03-06 ENCOUNTER — Encounter (HOSPITAL_COMMUNITY): Payer: Self-pay

## 2019-03-06 ENCOUNTER — Ambulatory Visit (INDEPENDENT_AMBULATORY_CARE_PROVIDER_SITE_OTHER): Payer: Federal, State, Local not specified - PPO

## 2019-03-06 ENCOUNTER — Other Ambulatory Visit: Payer: Self-pay

## 2019-03-06 ENCOUNTER — Ambulatory Visit (HOSPITAL_COMMUNITY)
Admission: EM | Admit: 2019-03-06 | Discharge: 2019-03-06 | Disposition: A | Discharge status: Home / Self Care | Payer: Federal, State, Local not specified - PPO | Attending: Emergency Medicine | Admitting: Emergency Medicine

## 2019-03-06 DIAGNOSIS — M25562 Pain in left knee: Secondary | ICD-10-CM

## 2019-03-06 DIAGNOSIS — M25462 Effusion, left knee: Secondary | ICD-10-CM | POA: Diagnosis not present

## 2019-03-06 MED ORDER — IBUPROFEN 600 MG PO TABS: 600.0000 mg | ORAL_TABLET | Freq: Four times a day (QID) | ORAL | 0 refills | Status: AC | PRN

## 2019-03-06 NOTE — ED Provider Notes (Signed)
[facility]    CSN: [id number] Arrival date & time: 03/06/19  1756      History   Chief Complaint Chief Complaint  Patient presents with  . Knee Pain    HPI Paula Sparks is a 53 y.o. female.   Patient presents with pain in her left knee x  4 days.  No falls or trauma at that time.  The pain became worse when Paula Sparks twisted it while walking yesterday.  Paula Sparks denies numbness, paresthesias, or weakness in her LE.  No treatments attempted at home.    The history is provided by the patient.    Past Medical History:  Diagnosis Date  . Cough   . GERD (gastroesophageal reflux disease)   . Hyperlipidemia   . Murmur, heart     Patient Active Problem List   Diagnosis Date Noted  . Morbid obesity (HCC) 02/25/2019  . Tibialis posterior tendinitis 03/14/2015  . Pes planus of both feet 03/14/2015  . Pronation deformity of both feet 03/14/2015  . Cardiac murmur 05/11/2011  . Cough 04/03/2011    Past Surgical History:  Procedure Laterality Date  . BREAST LUMPECTOMY  2003  . CESAREAN SECTION    . NASAL SINUS SURGERY      OB History   No obstetric history on file.      Home Medications    Prior to Admission medications   Medication Sig Start Date End Date Taking? Authorizing Provider  albuterol (VENTOLIN HFA) 108 (90 Base) MCG/ACT inhaler Inhale 2 puffs into the lungs every 6 (six) hours as needed for wheezing or shortness of breath. 12/11/18   Arnette FeltsMoore, Janece, FNP  aspirin EC 81 MG tablet Take 81 mg by mouth daily.    [provider]  budesonide-formoterol (SYMBICORT) 160-4.5 MCG/ACT inhaler Inhale 2 puffs into the lungs 2 (two) times a day. 12/11/18   Arnette FeltsMoore, Janece, FNP  cetirizine (ZYRTEC) 10 MG tablet Take 10 mg by mouth daily.    [provider]  fish oil-omega-3 fatty acids 1000 MG capsule Take 2 g by mouth daily.      [provider]  fluticasone (FLONASE) 50 MCG/ACT nasal spray Place 2 sprays into the nose daily. 04/03/11  08/20/16  Kalman Shanamaswamy, Murali, MD  ibuprofen (ADVIL) 600 MG tablet Take 1 tablet (600 mg total) by mouth every 6 (six) hours as needed. 03/06/19   Mickie Bailate, Kiran Carline H, NP  Ibuprofen-Famotidine 800-26.6 MG TABS Take 1 tablet 3 times daily as needed. Patient not taking: Reported on 02/25/2019 03/14/15   Judi SaaSmith, Zachary M, DO  ipratropium (ATROVENT) 0.06 % nasal spray Place 2 sprays into both nostrils 4 (four) times daily. Patient not taking: Reported on 02/25/2019 11/09/14   Linna HoffKindl, James D, MD  montelukast (SINGULAIR) 10 MG tablet Take 1 tablet (10 mg total) by mouth daily. 12/11/18 12/11/19  Arnette FeltsMoore, Janece, FNP  Multiple Vitamins-Minerals (MULTIVITAMIN WITH MINERALS) tablet Take 1 tablet by mouth daily.      [provider]  Omeprazole-Sodium Bicarbonate (ZEGERID OTC) 20-1100 MG CAPS Take 1 capsule on a empty stomach 04/03/11   Kalman Shanamaswamy, Murali, MD  Semaglutide,0.25 or 0.5MG /DOS, (OZEMPIC, 0.25 OR 0.5 MG/DOSE,) 2 MG/1.5ML SOPN Inject 0.5 mg into the skin once a week. 02/25/19 05/20/19  Arnette FeltsMoore, Janece, FNP    Family History Family History  Problem Relation Age of Onset  . Hypertension Mother   . Heart failure Mother   . Heart disease Neg Hx     Social History Social History   Tobacco  Use  . Smoking status: Never Smoker  . Smokeless tobacco: Never Used  Substance Use Topics  . Alcohol use: No  . Drug use: No     Allergies   Patient has no known allergies.   Review of Systems Review of Systems  Constitutional: Negative for chills and fever.  HENT: Negative for ear pain and sore throat.   Eyes: Negative for pain and visual disturbance.  Respiratory: Negative for cough and shortness of breath.   Cardiovascular: Negative for chest pain and palpitations.  Gastrointestinal: Negative for abdominal pain and vomiting.  Genitourinary: Negative for dysuria and hematuria.  Musculoskeletal: Positive for arthralgias. Negative for back pain.  Skin: Negative for color change and rash.   Neurological: Negative for seizures and syncope.  All other systems reviewed and are negative.    Physical Exam Triage Vital Signs ED Triage Vitals  Enc Vitals Group     BP 03/06/19 1839 113/66     Pulse Rate 03/06/19 1839 73     Resp 03/06/19 1839 16     Temp 03/06/19 1839 98.6 F (37 C)     Temp Source 03/06/19 1839 Oral     SpO2 03/06/19 1839 100 %     Weight --      Height --      Head Circumference --      Peak Flow --      Pain Score 03/06/19 1837 8     Pain Loc --      Pain Edu? --      Excl. in [place]? --    No data found.  Updated Vital Signs BP 113/66 (BP Location: Right Arm)   Pulse 73   Temp 98.6 F (37 C) (Oral)   Resp 16   LMP 03/04/2015   SpO2 100%   Visual Acuity Right Eye Distance:   Left Eye Distance:   Bilateral Distance:    Right Eye Near:   Left Eye Near:    Bilateral Near:     Physical Exam Vitals signs and nursing note reviewed.  Constitutional:      General: Paula Sparks is not in acute distress.    Appearance: Paula Sparks is well-developed.  HENT:     Head: Normocephalic and atraumatic.  Eyes:     Conjunctiva/sclera: Conjunctivae normal.  Neck:     Musculoskeletal: Neck supple.  Cardiovascular:     Rate and Rhythm: Normal rate and regular rhythm.     Heart sounds: No murmur.  Pulmonary:     Effort: Pulmonary effort is normal. No respiratory distress.     Breath sounds: Normal breath sounds.  Abdominal:     Palpations: Abdomen is soft.     Tenderness: There is no abdominal tenderness. There is no guarding or rebound.  Musculoskeletal: Normal range of motion.        General: No swelling, tenderness or deformity.  Skin:    General: Skin is warm and dry.     Capillary Refill: Capillary refill takes less than 2 seconds.     Findings: No bruising, erythema, lesion or rash.  Neurological:     General: No focal deficit present.     Mental Status: Paula Sparks is alert and oriented to person, place, and time.     Sensory: No sensory deficit.     Motor: No  weakness.      UC Treatments / Results  Labs (all labs ordered are listed, but only abnormal results are displayed) Labs Reviewed - No data to display  EKG   Radiology Dg Knee Complete 4 Views Left  Result Date: 03/06/2019 CLINICAL DATA:  Twisting injury, pain EXAM: LEFT KNEE - COMPLETE 4+ VIEW COMPARISON:  None. FINDINGS: No fracture or dislocation of the left knee. Joint spaces are well preserved. There is a large, nonspecific knee joint effusion. Soft tissues are unremarkable. IMPRESSION: No fracture or dislocation of the left knee. Joint spaces are well preserved. Large, nonspecific knee joint effusion. Electronically Signed   By: Lauralyn Primes M.D.   On: 03/06/2019 19:25    Procedures Procedures (including critical care time)  Medications Ordered in UC Medications - No data to display  Initial Impression / Assessment and Plan / UC Course  I have reviewed the triage vital signs and the nursing notes.  Pertinent labs & imaging results that were available during my care of the patient were reviewed by me and considered in my medical decision making (see chart for details).    Left knee pain.  Xray of left knee shows "No fracture or dislocation of the left knee. Joint spaces are well preserved. Large, nonspecific knee joint effusion."  Treating with prescription ibuprofen and RICE.  Discussed with patient that Paula Sparks should follow-up with an orthopedist if her pain continues or gets worse or Paula Sparks develops new symptoms such as weakness, numbness, paresthesias.  Patient agrees with plan of care.     Final Clinical Impressions(s) / UC Diagnoses   Final diagnoses:  Acute pain of left knee     Discharge Instructions     Take the ibuprofen as prescribed.  Rest and elevate your knee.  Apply ice packs 2-3 times a day for up to 20 minutes each.    Follow up with an orthopedist if you symptoms continue or worsen;  Or if you develop new symptoms, such as numbness, tingling, or weakness.          ED Prescriptions    Medication Sig Dispense Auth. Provider   ibuprofen (ADVIL) 600 MG tablet Take 1 tablet (600 mg total) by mouth every 6 (six) hours as needed. 30 tablet Mickie Bail, NP     I have reviewed the PDMP during this encounter.   Mickie Bail, NP 03/06/19 1935

## 2019-03-06 NOTE — ED Triage Notes (Signed)
Patient presents to Urgent Care with complaints of left knee pain since a few days ago. Patient reports she felt like she twisted it yesterday while walking and made it worse, states arthritis runs in her family.

## 2019-03-06 NOTE — Discharge Instructions (Signed)
Take the ibuprofen as prescribed.  Rest and elevate your knee.  Apply ice packs 2-3 times a day for up to 20 minutes each.    Follow up with an orthopedist if you symptoms continue or worsen;  Or if you develop new symptoms, such as numbness, tingling, or weakness.

## 2019-05-12 ENCOUNTER — Encounter: Payer: Self-pay | Admitting: Nurse Practitioner

## 2019-05-13 ENCOUNTER — Telehealth: Payer: Self-pay

## 2019-05-13 NOTE — Telephone Encounter (Signed)
LVM for pt to call the office   Per YR call  Paula Sparks and ask her what are the diabetic shoes for? she has prediabetes and that code was denied by her ins for the shoes is it for swelling? [phone]

## 2019-05-28 ENCOUNTER — Encounter: Payer: Self-pay | Admitting: Nurse Practitioner

## 2019-05-28 ENCOUNTER — Ambulatory Visit: Payer: Federal, State, Local not specified - PPO | Admitting: Nurse Practitioner

## 2019-05-28 ENCOUNTER — Other Ambulatory Visit: Payer: Self-pay

## 2019-05-28 VITALS — BP 132/80 | HR 59 | Temp 98.1°F | Ht 59.6 in | Wt 205.2 lb

## 2019-05-28 DIAGNOSIS — Z6841 Body Mass Index (BMI) 40.0 and over, adult: Secondary | ICD-10-CM | POA: Diagnosis not present

## 2019-05-28 DIAGNOSIS — Z8709 Personal history of other diseases of the respiratory system: Secondary | ICD-10-CM

## 2019-05-28 DIAGNOSIS — R7303 Prediabetes: Secondary | ICD-10-CM

## 2019-05-28 DIAGNOSIS — M25471 Effusion, right ankle: Secondary | ICD-10-CM | POA: Diagnosis not present

## 2019-05-28 DIAGNOSIS — K219 Gastro-esophageal reflux disease without esophagitis: Secondary | ICD-10-CM

## 2019-05-28 MED ORDER — MONTELUKAST SODIUM 10 MG PO TABS: 10.0000 mg | ORAL_TABLET | Freq: Every day | ORAL | 1 refills | Status: DC

## 2019-05-28 MED ORDER — RYBELSUS 7 MG PO TABS: 1.0000 | ORAL_TABLET | Freq: Every day | ORAL | 2 refills | Status: DC

## 2019-05-28 MED ORDER — OMEPRAZOLE MAGNESIUM 20 MG PO TBEC: 20.0000 mg | DELAYED_RELEASE_TABLET | Freq: Every day | ORAL | 1 refills | Status: DC

## 2019-05-28 MED ORDER — ALBUTEROL SULFATE HFA 108 (90 BASE) MCG/ACT IN AERS: 2.0000 | INHALATION_SPRAY | Freq: Four times a day (QID) | RESPIRATORY_TRACT | 1 refills | Status: DC | PRN

## 2019-05-28 MED FILL — OMEPRAZOLE DR 20 MG CAPSULE: 20 | 90 days supply | Qty: 90 | Fill #0

## 2019-05-28 MED FILL — MONTELUKAST SOD 10 MG TAB: 10 | 90 days supply | Qty: 90 | Fill #1

## 2019-05-28 NOTE — Progress Notes (Signed)
This visit occurred during the SARS-CoV-2 public health emergency.  Safety protocols were in place, including screening questions prior to the visit, additional usage of staff PPE, and extensive cleaning of exam room while observing appropriate contact time as indicated for disinfecting solutions.  Subjective:     Patient ID: Paula Sparks , female    DOB: 12-Apr-1966 , 54 y.o.   MRN: [id number]   Chief Complaint  Patient presents with  . Weight Loss  . Diabetes    HPI  Wt Readings from Last 3 Encounters: 05/28/19 : 205 lb 3.2 oz (93.1 kg) 02/25/19 : 212 lb 3.2 oz (96.3 kg) 12/11/18 : 207 lb 6.4 oz (94.1 kg)  She is also here for weight management. She feels she is full quicker with the rybelsus  Diabetes She presents for her follow-up diabetic visit. Diabetes type: prediabetes. Her disease course has been stable. There are no hypoglycemic associated symptoms. Pertinent negatives for diabetes include no chest pain. There are no hypoglycemic complications. There are no diabetic complications. Risk factors for coronary artery disease include sedentary lifestyle and obesity. Current diabetic treatment includes oral agent (monotherapy). She is compliant with treatment all of the time. She has not had a previous visit with a dietitian.     Past Medical History:  Diagnosis Date  . Cough   . GERD (gastroesophageal reflux disease)   . Hyperlipidemia   . Murmur, heart      Family History  Problem Relation Age of Onset  . Hypertension Mother   . Heart failure Mother   . Heart disease Neg Hx      Current Outpatient Medications:  .  albuterol (VENTOLIN HFA) 108 (90 Base) MCG/ACT inhaler, Inhale 2 puffs into the lungs every 6 (six) hours as needed for wheezing or shortness of breath., Disp: 18 g, Rfl: 1 .  aspirin EC 81 MG tablet, Take 81 mg by mouth daily., Disp: , Rfl:  .  budesonide-formoterol (SYMBICORT) 160-4.5 MCG/ACT inhaler, Inhale 2 puffs into the lungs 2 (two)  times a day., Disp: 1 Inhaler, Rfl: 6 .  cetirizine (ZYRTEC) 10 MG tablet, Take 10 mg by mouth daily., Disp: , Rfl:  .  fish oil-omega-3 fatty acids 1000 MG capsule, Take 2 g by mouth daily.  , Disp: , Rfl:  .  ibuprofen (ADVIL) 600 MG tablet, Take 1 tablet (600 mg total) by mouth every 6 (six) hours as needed., Disp: 30 tablet, Rfl: 0 .  montelukast (SINGULAIR) 10 MG tablet, Take 1 tablet (10 mg total) by mouth daily., Disp: 90 tablet, Rfl: 1 .  Multiple Vitamins-Minerals (MULTIVITAMIN WITH MINERALS) tablet, Take 1 tablet by mouth daily.  , Disp: , Rfl:  .  Omeprazole-Sodium Bicarbonate (ZEGERID OTC) 20-1100 MG CAPS, Take 1 capsule on a empty stomach, Disp: 30 each, Rfl: 2 .  fluticasone (FLONASE) 50 MCG/ACT nasal spray, Place 2 sprays into the nose daily., Disp: 16 g, Rfl: 2 .  Ibuprofen-Famotidine 800-26.6 MG TABS, Take 1 tablet 3 times daily as needed. (Patient not taking: Reported on 02/25/2019), Disp: 270 tablet, Rfl: 3 .  ipratropium (ATROVENT) 0.06 % nasal spray, Place 2 sprays into both nostrils 4 (four) times daily. (Patient not taking: Reported on 02/25/2019), Disp: 15 mL, Rfl: 1   No Known Allergies   Review of Systems  Constitutional: Negative.   Respiratory: Negative.   Cardiovascular: Negative.  Negative for chest pain, palpitations and leg swelling.  Psychiatric/Behavioral: Negative.      Today's Vitals   05/28/19 1640  BP: 132/80  Pulse: (!) 59  Temp: 98.1 F (36.7 C)  Weight: 205 lb 3.2 oz (93.1 kg)  Height: 4' 11.6" (1.514 m)   Body mass index is 40.62 kg/m.   Objective:  Physical Exam Constitutional:      Appearance: Normal appearance.  Cardiovascular:     Rate and Rhythm: Normal rate and regular rhythm.     Pulses: Normal pulses.     Heart sounds: Normal heart sounds. No murmur.  Pulmonary:     Effort: Pulmonary effort is normal. No respiratory distress.     Breath sounds: Normal breath sounds.  Neurological:     General: No focal deficit present.      Mental Status: She is alert and oriented to person, place, and time.  Psychiatric:        Mood and Affect: Mood normal.        Behavior: Behavior normal.        Thought Content: Thought content normal.        Judgment: Judgment normal.         Assessment And Plan:    1. Prediabetes  Chronic, doing well with rybelsus  Continue with medications - Semaglutide (RYBELSUS) 7 MG TABS; Take 1 tablet by mouth daily. Take 30 minutes before meal  Dispense: 30 tablet; Refill: 2  2. Morbid obesity (HCC)  She has had a 7 lb weight loss since her last visit  3. Right ankle swelling  Chronic intermittent swelling to right ankle  She has diabetic shoes and compression socks  4. History of asthma  - albuterol (VENTOLIN HFA) 108 (90 Base) MCG/ACT inhaler; Inhale 2 puffs into the lungs every 6 (six) hours as needed for wheezing or shortness of breath.  Dispense: 18 g; Refill: 1 - montelukast (SINGULAIR) 10 MG tablet; Take 1 tablet (10 mg total) by mouth daily.  Dispense: 90 tablet; Refill: 1  5. Gastroesophageal reflux disease without esophagitis  Chronic, refill of omeprazole  Encouraged to take a break from the medication when feeling better - omeprazole (PRILOSEC OTC) 20 MG tablet; Take 1 tablet (20 mg total) by mouth daily.  Dispense: 90 tablet; Refill: 1       Arnette Felts, FNP    THE PATIENT IS ENCOURAGED TO PRACTICE SOCIAL DISTANCING DUE TO THE COVID-19 PANDEMIC.

## 2019-05-29 LAB — HEMOGLOBIN A1C
Est. average glucose Bld gHb Est-mCnc: 123 mg/dL
Hgb A1c MFr Bld: 5.9 % — ABNORMAL HIGH (ref 4.8–5.6)

## 2019-05-29 LAB — BMP8+EGFR
BUN/Creatinine Ratio: 21 (ref 9–23)
BUN: 15 mg/dL (ref 6–24)
CO2: 26 mmol/L (ref 20–29)
Calcium: 9.6 mg/dL (ref 8.7–10.2)
Chloride: 104 mmol/L (ref 96–106)
Creatinine, Ser: 0.73 mg/dL (ref 0.57–1.00)
GFR calc Af Amer: 107 mL/min/{1.73_m2} (ref >59)
GFR calc non Af Amer: 93 mL/min/{1.73_m2} (ref >59)
Glucose: 87 mg/dL (ref 65–99)
Potassium: 4.4 mmol/L (ref 3.5–5.2)
Sodium: 140 mmol/L (ref 134–144)

## 2019-07-08 MED FILL — RYBELSUS 7 MG TABS: 7 | 30 days supply | Qty: 30 | Fill #0

## 2019-08-06 DIAGNOSIS — K080 Exfoliation of teeth due to systemic causes: Secondary | ICD-10-CM | POA: Diagnosis not present

## 2019-08-14 MED FILL — RYBELSUS 7 MG TABS: 7 | 30 days supply | Qty: 30 | Fill #1

## 2019-08-26 ENCOUNTER — Ambulatory Visit: Payer: Self-pay | Admitting: Nurse Practitioner

## 2019-09-14 MED FILL — OMEPRAZOLE DR 20 MG CAPSULE: 20 | 90 days supply | Qty: 90 | Fill #1

## 2019-09-14 MED FILL — RYBELSUS 7 MG TABS: 7 | 30 days supply | Qty: 30 | Fill #2

## 2019-09-14 MED FILL — MONTELUKAST SOD 10 MG TAB: 10 | 90 days supply | Qty: 90 | Fill #0

## 2019-10-13 DIAGNOSIS — Z1231 Encounter for screening mammogram for malignant neoplasm of breast: Secondary | ICD-10-CM | POA: Diagnosis not present

## 2019-10-13 DIAGNOSIS — R87610 Atypical squamous cells of undetermined significance on cytologic smear of cervix (ASC-US): Secondary | ICD-10-CM | POA: Diagnosis not present

## 2019-10-13 DIAGNOSIS — Z6837 Body mass index (BMI) 37.0-37.9, adult: Secondary | ICD-10-CM | POA: Diagnosis not present

## 2019-10-13 DIAGNOSIS — Z01419 Encounter for gynecological examination (general) (routine) without abnormal findings: Secondary | ICD-10-CM | POA: Diagnosis not present

## 2019-10-13 DIAGNOSIS — Z1151 Encounter for screening for human papillomavirus (HPV): Secondary | ICD-10-CM | POA: Diagnosis not present

## 2019-10-13 LAB — HM MAMMOGRAPHY

## 2019-10-14 ENCOUNTER — Other Ambulatory Visit: Payer: Self-pay | Admitting: Nurse Practitioner

## 2019-10-14 ENCOUNTER — Encounter: Payer: Self-pay | Admitting: Nurse Practitioner

## 2019-10-14 DIAGNOSIS — R7303 Prediabetes: Secondary | ICD-10-CM

## 2019-10-14 MED FILL — RYBELSUS 7 MG TABS: 7 | 30 days supply | Qty: 30 | Fill #0

## 2019-10-30 DIAGNOSIS — R9389 Abnormal findings on diagnostic imaging of other specified body structures: Secondary | ICD-10-CM | POA: Diagnosis not present

## 2019-10-30 DIAGNOSIS — D251 Intramural leiomyoma of uterus: Secondary | ICD-10-CM | POA: Diagnosis not present

## 2019-10-30 DIAGNOSIS — N950 Postmenopausal bleeding: Secondary | ICD-10-CM | POA: Diagnosis not present

## 2019-11-02 DIAGNOSIS — R9389 Abnormal findings on diagnostic imaging of other specified body structures: Secondary | ICD-10-CM | POA: Diagnosis not present

## 2019-11-13 MED FILL — RYBELSUS 7 MG TABS: 7 | 30 days supply | Qty: 30 | Fill #1

## 2019-12-14 DIAGNOSIS — Z03818 Encounter for observation for suspected exposure to other biological agents ruled out: Secondary | ICD-10-CM | POA: Diagnosis not present

## 2019-12-14 DIAGNOSIS — Z20822 Contact with and (suspected) exposure to covid-19: Secondary | ICD-10-CM | POA: Diagnosis not present

## 2019-12-14 DIAGNOSIS — Z209 Contact with and (suspected) exposure to unspecified communicable disease: Secondary | ICD-10-CM | POA: Diagnosis not present

## 2019-12-22 ENCOUNTER — Telehealth: Payer: Self-pay

## 2019-12-22 NOTE — Telephone Encounter (Signed)
I called to prechart for the pt's appointment and was told that the pt isn't home that she is out of the country and will not return until this Sunday and that he thought the pt had already cancelled the appt.

## 2019-12-23 ENCOUNTER — Encounter: Payer: Federal, State, Local not specified - PPO | Admitting: Nurse Practitioner

## 2019-12-28 MED FILL — RYBELSUS 7 MG TABS: 7 | 30 days supply | Qty: 30 | Fill #2

## 2020-01-19 ENCOUNTER — Other Ambulatory Visit: Payer: Self-pay | Admitting: Nurse Practitioner

## 2020-01-19 DIAGNOSIS — R7303 Prediabetes: Secondary | ICD-10-CM

## 2020-01-29 ENCOUNTER — Other Ambulatory Visit: Payer: Self-pay | Admitting: Nurse Practitioner

## 2020-01-29 DIAGNOSIS — K219 Gastro-esophageal reflux disease without esophagitis: Secondary | ICD-10-CM

## 2020-03-17 DIAGNOSIS — K080 Exfoliation of teeth due to systemic causes: Secondary | ICD-10-CM | POA: Diagnosis not present

## 2020-04-25 ENCOUNTER — Encounter: Payer: Self-pay | Admitting: Nurse Practitioner

## 2020-04-25 ENCOUNTER — Other Ambulatory Visit: Payer: Self-pay

## 2020-04-25 ENCOUNTER — Ambulatory Visit (INDEPENDENT_AMBULATORY_CARE_PROVIDER_SITE_OTHER): Payer: Federal, State, Local not specified - PPO | Admitting: Nurse Practitioner

## 2020-04-25 ENCOUNTER — Other Ambulatory Visit: Payer: Self-pay | Admitting: Nurse Practitioner

## 2020-04-25 VITALS — BP 124/80 | HR 64 | Temp 98.0°F | Ht 61.0 in | Wt 205.4 lb

## 2020-04-25 DIAGNOSIS — R7303 Prediabetes: Secondary | ICD-10-CM

## 2020-04-25 DIAGNOSIS — M2550 Pain in unspecified joint: Secondary | ICD-10-CM

## 2020-04-25 DIAGNOSIS — Z8709 Personal history of other diseases of the respiratory system: Secondary | ICD-10-CM

## 2020-04-25 DIAGNOSIS — M542 Cervicalgia: Secondary | ICD-10-CM | POA: Diagnosis not present

## 2020-04-25 DIAGNOSIS — K219 Gastro-esophageal reflux disease without esophagitis: Secondary | ICD-10-CM

## 2020-04-25 MED ORDER — RYBELSUS 7 MG PO TABS: 1.0000 | ORAL_TABLET | Freq: Every day | ORAL | 1 refills | Status: DC

## 2020-04-25 MED ORDER — MONTELUKAST SODIUM 10 MG PO TABS: 10.0000 mg | ORAL_TABLET | Freq: Every day | ORAL | 1 refills | Status: DC

## 2020-04-25 MED ORDER — BUDESONIDE-FORMOTEROL FUMARATE 160-4.5 MCG/ACT IN AERO: 2.0000 | INHALATION_SPRAY | Freq: Every day | RESPIRATORY_TRACT | 6 refills | Status: DC

## 2020-04-25 MED ORDER — OMEPRAZOLE MAGNESIUM 20 MG PO TBEC: 20.0000 mg | DELAYED_RELEASE_TABLET | Freq: Every day | ORAL | 1 refills | Status: DC

## 2020-04-25 MED ORDER — ALBUTEROL SULFATE HFA 108 (90 BASE) MCG/ACT IN AERS
2.0000 | INHALATION_SPRAY | Freq: Four times a day (QID) | RESPIRATORY_TRACT | 1 refills | Status: DC | PRN
Start: 2020-04-25 — End: 2020-04-25

## 2020-04-25 MED ORDER — MELOXICAM 7.5 MG PO TABS: 7.5000 mg | ORAL_TABLET | Freq: Every day | ORAL | 2 refills | Status: DC

## 2020-04-25 MED FILL — MELOXICAM 7.5 MG TABLET: 7.5 | 30 days supply | Qty: 30 | Fill #0

## 2020-04-25 MED FILL — OMEPRAZOLE DR 20 MG CAPSULE: 20 | 90 days supply | Qty: 90 | Fill #0

## 2020-04-25 MED FILL — MONTELUKAST SOD 10 MG TAB: 10 | 90 days supply | Qty: 90 | Fill #0

## 2020-04-25 MED FILL — ALBUTEROL SULFATE HFA 108 (: 108 (90 BAS | 25 days supply | Qty: 18 | Fill #0

## 2020-04-25 MED FILL — SYMBICORT 160-4.5 MCG INH: 160-4.5 | 60 days supply | Qty: 10 | Fill #0

## 2020-04-25 NOTE — Progress Notes (Signed)
I,Yamilka Roman [employer] as a Neurosurgeon for [employer], FNP.,have documented all relevant documentation on the behalf of Arnette Felts, FNP,as directed by  Arnette Felts, FNP while in the presence of Arnette Felts, FNP. This visit occurred during the SARS-CoV-2 public health emergency.  Safety protocols were in place, including screening questions prior to the visit, additional usage of staff PPE, and extensive cleaning of exam room while observing appropriate contact time as indicated for disinfecting solutions.  Subjective:     Patient ID: Paula Sparks , female    DOB: [birth year] , 54 y.o.   MRN: [id number]   Chief Complaint  Patient presents with  . Prediabetes    HPI  Here for rybelsus follow up - she had done well with Rybelsus in conjunction with her change in diet. She had a job change and was having insurance problems.  She is working at [employer] as needed. She is now at the [place] full time.   Wt Readings from Last 3 Encounters: 04/25/20 : 205 lb 6.4 oz (93.2 kg) 05/28/19 : 205 lb 3.2 oz (93.1 kg) 02/25/19 : 212 lb 3.2 oz (96.3 kg)  Her weight dropped to 185 from 205 in August  She had a CXR at [place] earlier part of the year and was normal    Past Medical History:  Diagnosis Date  . Cough   . GERD (gastroesophageal reflux disease)   . Hyperlipidemia   . Murmur, heart      Family History  Problem Relation Age of Onset  . Hypertension Mother   . Heart failure Mother   . Heart disease Neg Hx      Current Outpatient Medications:  .  albuterol (VENTOLIN HFA) 108 (90 Base) MCG/ACT inhaler, Inhale 2 puffs into the lungs every 6 (six) hours as needed for wheezing or shortness of breath., Disp: 18 g, Rfl: 1 .  aspirin EC 81 MG tablet, Take 81 mg by mouth daily., Disp: , Rfl:  .  budesonide-formoterol (SYMBICORT) 160-4.5 MCG/ACT inhaler, Inhale 2 puffs into the lungs 2 (two) times a day., Disp: 1 Inhaler, Rfl: 6 .  cetirizine (ZYRTEC) 10 MG tablet, Take 10 mg by  mouth daily., Disp: , Rfl:  .  fish oil-omega-3 fatty acids 1000 MG capsule, Take 2 g by mouth daily., Disp: , Rfl:  .  fluticasone (FLONASE) 50 MCG/ACT nasal spray, Place 2 sprays into the nose daily., Disp: 16 g, Rfl: 2 .  ibuprofen (ADVIL) 600 MG tablet, Take 1 tablet (600 mg total) by mouth every 6 (six) hours as needed., Disp: 30 tablet, Rfl: 0 .  montelukast (SINGULAIR) 10 MG tablet, Take 1 tablet (10 mg total) by mouth daily., Disp: 90 tablet, Rfl: 1 .  Multiple Vitamins-Minerals (MULTIVITAMIN WITH MINERALS) tablet, Take 1 tablet by mouth daily., Disp: , Rfl:  .  omeprazole (PRILOSEC OTC) 20 MG tablet, Take 1 tablet (20 mg total) by mouth daily., Disp: 90 tablet, Rfl: 1 .  ipratropium (ATROVENT) 0.06 % nasal spray, Place 2 sprays into both nostrils 4 (four) times daily. (Patient not taking: No sig reported), Disp: 15 mL, Rfl: 1 .  meloxicam (MOBIC) 7.5 MG tablet, Take 1 tablet (7.5 mg total) by mouth daily., Disp: 30 tablet, Rfl: 2 .  Semaglutide (RYBELSUS) 7 MG TABS, Take 1 tablet by mouth daily. Take 30 minutes before meal, Disp: 90 tablet, Rfl: 1   No Known Allergies   Review of Systems  Constitutional: Negative.   Respiratory: Negative.   Cardiovascular: Negative for  chest pain, palpitations and leg swelling.  Neurological: Negative for dizziness and headaches.  Psychiatric/Behavioral: Negative.      Today's Vitals   04/25/20 1236  BP: 124/80  Pulse: 64  Temp: 98 F (36.7 C)  TempSrc: Oral  Weight: 205 lb 6.4 oz (93.2 kg)  Height: 5\' 1"  (1.549 m)  PainSc: 5   PainLoc: Neck   Body mass index is 38.81 kg/m.   Objective:  Physical Exam Constitutional:      General: She is not in acute distress.    Appearance: Normal appearance. She is obese.  Cardiovascular:     Rate and Rhythm: Normal rate and regular rhythm.     Pulses: Normal pulses.     Heart sounds: Normal heart sounds. No murmur heard.   Pulmonary:     Effort: Pulmonary effort is normal. No respiratory  distress.     Breath sounds: Normal breath sounds. No wheezing.  Musculoskeletal:        General: Tenderness (posterior neck tension) present. No deformity.  Neurological:     General: No focal deficit present.     Mental Status: She is alert and oriented to person, place, and time.     Cranial Nerves: No cranial nerve deficit.  Psychiatric:        Mood and Affect: Mood normal.        Behavior: Behavior normal.        Thought Content: Thought content normal.        Judgment: Judgment normal.         Assessment And Plan:     1. Prediabetes  Chronic, she has not been seen in about a year due to her not having insurance  She had been taking Rybelsus and tolerated well - Semaglutide (RYBELSUS) 7 MG TABS; Take 1 tablet by mouth daily. Take 30 minutes before meal  Dispense: 90 tablet; Refill: 1  2. Morbid obesity (HCC) Chronic, she has lost weight since her last visit  Wt Readings from Last 3 Encounters:  04/25/20 205 lb 6.4 oz (93.2 kg)  05/28/19 205 lb 3.2 oz (93.1 kg)  02/25/19 212 lb 3.2 oz (96.3 kg)    3. Neck pain  She is having posterior neck discomfort will start with meloxicam - meloxicam (MOBIC) 7.5 MG tablet; Take 1 tablet (7.5 mg total) by mouth daily.  Dispense: 30 tablet; Refill: 2  4. Arthralgia, unspecified joint Intermittent joint pain will try meloxicam  - meloxicam (MOBIC) 7.5 MG tablet; Take 1 tablet (7.5 mg total) by mouth daily.  Dispense: 30 tablet; Refill: 2     Patient was given opportunity to ask questions. Patient verbalized understanding of the plan and was able to repeat key elements of the plan. All questions were answered to their satisfaction.   02/27/19, FNP, have reviewed all documentation for this visit. The documentation on 04/25/20 for the exam, diagnosis, procedures, and orders are all accurate and complete.   THE PATIENT IS ENCOURAGED TO PRACTICE SOCIAL DISTANCING DUE TO THE COVID-19 PANDEMIC.

## 2020-04-26 LAB — BMP8+EGFR
BUN/Creatinine Ratio: 25 — ABNORMAL HIGH (ref 9–23)
BUN: 19 mg/dL (ref 6–24)
CO2: 26 mmol/L (ref 20–29)
Calcium: 9.6 mg/dL (ref 8.7–10.2)
Chloride: 101 mmol/L (ref 96–106)
Creatinine, Ser: 0.76 mg/dL (ref 0.57–1.00)
GFR calc Af Amer: 101 mL/min/{1.73_m2} (ref >59)
GFR calc non Af Amer: 88 mL/min/{1.73_m2} (ref >59)
Glucose: 92 mg/dL (ref 65–99)
Potassium: 5.1 mmol/L (ref 3.5–5.2)
Sodium: 141 mmol/L (ref 134–144)

## 2020-04-26 LAB — HEMOGLOBIN A1C
Est. average glucose Bld gHb Est-mCnc: 126 mg/dL
Hgb A1c MFr Bld: 6 % — ABNORMAL HIGH (ref 4.8–5.6)

## 2020-06-02 MED FILL — MELOXICAM 7.5 MG TABLET: 7.5 | 30 days supply | Qty: 30 | Fill #1

## 2020-06-02 MED FILL — RYBELSUS 7 MG TABS: 7 | 90 days supply | Qty: 90 | Fill #0

## 2020-08-24 ENCOUNTER — Telehealth: Payer: Self-pay

## 2020-08-24 ENCOUNTER — Ambulatory Visit: Payer: Federal, State, Local not specified - PPO | Admitting: Nurse Practitioner

## 2020-08-24 NOTE — Telephone Encounter (Signed)
I left pt vm to call the office so we can reschedule her appt she missed today on 08/24/20. Paula Sparks

## 2020-09-14 ENCOUNTER — Other Ambulatory Visit (HOSPITAL_COMMUNITY): Payer: Self-pay

## 2020-09-14 MED FILL — Semaglutide Tab 7 MG: ORAL | 90 days supply | Qty: 90 | Fill #0 | Status: AC

## 2020-09-14 MED FILL — Meloxicam Tab 7.5 MG: ORAL | 30 days supply | Qty: 30 | Fill #0 | Status: AC

## 2020-09-16 ENCOUNTER — Other Ambulatory Visit (HOSPITAL_COMMUNITY): Payer: Self-pay

## 2020-11-07 ENCOUNTER — Ambulatory Visit: Payer: Federal, State, Local not specified - PPO | Admitting: Nurse Practitioner

## 2020-11-07 ENCOUNTER — Other Ambulatory Visit: Payer: Self-pay

## 2020-11-07 ENCOUNTER — Encounter: Payer: Self-pay | Admitting: Nurse Practitioner

## 2020-11-07 ENCOUNTER — Other Ambulatory Visit (HOSPITAL_COMMUNITY): Payer: Self-pay

## 2020-11-07 VITALS — BP 114/78 | HR 68 | Temp 98.7°F | Ht 61.0 in | Wt 207.6 lb

## 2020-11-07 DIAGNOSIS — R21 Rash and other nonspecific skin eruption: Secondary | ICD-10-CM | POA: Diagnosis not present

## 2020-11-07 DIAGNOSIS — Z1231 Encounter for screening mammogram for malignant neoplasm of breast: Secondary | ICD-10-CM

## 2020-11-07 DIAGNOSIS — Z6839 Body mass index (BMI) 39.0-39.9, adult: Secondary | ICD-10-CM

## 2020-11-07 DIAGNOSIS — E7800 Pure hypercholesterolemia, unspecified: Secondary | ICD-10-CM

## 2020-11-07 DIAGNOSIS — E6609 Other obesity due to excess calories: Secondary | ICD-10-CM

## 2020-11-07 DIAGNOSIS — R7303 Prediabetes: Secondary | ICD-10-CM | POA: Diagnosis not present

## 2020-11-07 MED ORDER — RYBELSUS 14 MG PO TABS
1.0000 | ORAL_TABLET | Freq: Every day | ORAL | 1 refills | Status: DC
  Filled 2020-11-07 – 2020-11-16 (×2): qty 90, 90d supply, fill #0
  Filled 2021-03-30: qty 90, 90d supply, fill #1

## 2020-11-07 NOTE — Progress Notes (Signed)
I,Yamilka Roman [employer] as a Education administrator for [employer], FNP.,have documented all relevant documentation on the behalf of Minette Brine, FNP,as directed by  Minette Brine, FNP while in the presence of Minette Brine, [place].  This visit occurred during the SARS-CoV-2 public health emergency.  Safety protocols were in place, including screening questions prior to the visit, additional usage of staff PPE, and extensive cleaning of exam room while observing appropriate contact time as indicated for disinfecting solutions.  Subjective:     Patient ID: Paula Sparks , female    DOB: [birth year] , 55 y.o.   MRN: [id number]   Chief Complaint  Patient presents with   Prediabetes    HPI  Patient presents today for a prediabetes f/u. She is currently taking rybelsus 57m daily and she is tolerating it well.   Wt Readings from Last 3 Encounters: 11/07/20 : 207 lb 9.6 oz (94.2 kg) 04/25/20 : 205 lb 6.4 oz (93.2 kg) 05/28/19 : 205 lb 3.2 oz (93.1 kg)      Past Medical History:  Diagnosis Date   Cough    GERD (gastroesophageal reflux disease)    Hyperlipidemia    Murmur, heart      Family History  Problem Relation Age of Onset   Hypertension Mother    Heart failure Mother    Heart disease Neg Hx      Current Outpatient Medications:    albuterol (VENTOLIN HFA) 108 (90 Base) MCG/ACT inhaler, INHALE 2 PUFFS INTO THE LUNGS EVERY 6 (SIX) HOURS AS NEEDED FOR WHEEZING OR SHORTNESS OF BREATH., Disp: 18 g, Rfl: 1   aspirin EC 81 MG tablet, Take 81 mg by mouth daily., Disp: , Rfl:    budesonide-formoterol (SYMBICORT) 160-4.5 MCG/ACT inhaler, INHALE 2 PUFFS INTO THE LUNGS DAILY., Disp: 10.2 g, Rfl: 6   cetirizine (ZYRTEC) 10 MG tablet, Take 10 mg by mouth daily., Disp: , Rfl:    fish oil-omega-3 fatty acids 1000 MG capsule, Take 2 g by mouth daily., Disp: , Rfl:    fluticasone (FLONASE) 50 MCG/ACT nasal spray, Place 2 sprays into the nose daily., Disp: 16 g, Rfl: 2   ibuprofen (ADVIL) 600  MG tablet, Take 1 tablet (600 mg total) by mouth every 6 (six) hours as needed., Disp: 30 tablet, Rfl: 0   meloxicam (MOBIC) 7.5 MG tablet, TAKE 1 TABLET (7.5 MG TOTAL) BY MOUTH DAILY., Disp: 30 tablet, Rfl: 2   montelukast (SINGULAIR) 10 MG tablet, TAKE 1 TABLET (10 MG TOTAL) BY MOUTH DAILY., Disp: 90 tablet, Rfl: 1   Multiple Vitamins-Minerals (MULTIVITAMIN WITH MINERALS) tablet, Take 1 tablet by mouth daily., Disp: , Rfl:    omeprazole (PRILOSEC OTC) 20 MG tablet, Take 1 tablet (20 mg total) by mouth daily., Disp: 90 tablet, Rfl: 1   Semaglutide (RYBELSUS) 14 MG TABS, Take 1 tablet by mouth daily. Take 30 minutes before breakfast, Disp: 90 tablet, Rfl: 1   omeprazole (PRILOSEC) 20 MG capsule, TAKE 1 CAPSULE BY MOUTH ONCE DAILY (Patient not taking: Reported on 11/07/2020), Disp: 90 capsule, Rfl: 1   No Known Allergies   Review of Systems  Constitutional: Negative.   Eyes: Negative.   Respiratory: Negative.    Cardiovascular: Negative.  Negative for chest pain, palpitations and leg swelling.  Musculoskeletal: Negative.   Skin: Negative.   Neurological:  Negative for dizziness and headaches.  Psychiatric/Behavioral: Negative.      Today's Vitals   11/07/20 1612  BP: 114/78  Pulse: 68  Temp: 98.7 F (37.1 C)  Weight: 207 lb 9.6 oz (94.2 kg)  Height: 5' 1"  (1.549 m)  PainSc: 0-No pain   Body mass index is 39.23 kg/m.   Objective:  Physical Exam Constitutional:      General: She is not in acute distress.    Appearance: Normal appearance. She is obese.  Cardiovascular:     Rate and Rhythm: Normal rate and regular rhythm.     Pulses: Normal pulses.     Heart sounds: Normal heart sounds. No murmur heard. Pulmonary:     Effort: Pulmonary effort is normal. No respiratory distress.     Breath sounds: Normal breath sounds. No wheezing.  Musculoskeletal:        General: Tenderness (posterior neck tension) present. No deformity.  Skin:    Findings: Rash (left foot with  hyperpigmented rash) present.  Neurological:     General: No focal deficit present.     Mental Status: She is alert and oriented to person, place, and time.     Cranial Nerves: No cranial nerve deficit.  Psychiatric:        Mood and Affect: Mood normal.        Behavior: Behavior normal.        Thought Content: Thought content normal.        Judgment: Judgment normal.        Assessment And Plan:     1. Prediabetes Chronic, no significant change in her HgbA1c at her last visit She is to continue taking Rybelsus daily - Semaglutide (RYBELSUS) 14 MG TABS; Take 1 tablet by mouth daily. Take 30 minutes before breakfast  Dispense: 90 tablet; Refill: 1 - Hemoglobin A1c; Future - BMP8+eGFR; Future - Hemoglobin A1c - BMP8+eGFR  2. Class 2 obesity due to excess calories without serious comorbidity with body mass index (BMI) of 39.0 to 39.9 in adult Chronic Discussed healthy diet and regular exercise options  Encouraged to exercise at least 150 minutes per week with 2 days of strength training  3. Morbid obesity ([place])  4. Rash and nonspecific skin eruption She has hyperpigmented skin to her right foot unclear on cause Will refer to Dermatology, she has tried several ointments and creams - Ambulatory referral to Dermatology  5. Elevated cholesterol Chronic, controlled No current medications Discussed a low fat diet - Lipid panel; Future - Lipid panel  6. Encounter for screening mammogram for breast cancer Pt instructed on Self Breast Exam.According to ACOG guidelines Women aged 30 and older are recommended to get an annual mammogram. Form completed and given to patient contact the [facility] for appointment scheduing.  Pt encouraged to get annual mammogram - MM Digital Screening; Future     Patient was given opportunity to ask questions. Patient verbalized understanding of the plan and was able to repeat key elements of the plan. All questions were answered to their  satisfaction.  Minette Brine, FNP   I, Minette Brine, FNP, have reviewed all documentation for this visit. The documentation on 11/08/20 for the exam, diagnosis, procedures, and orders are all accurate and complete.   IF YOU HAVE BEEN REFERRED TO A SPECIALIST, IT MAY TAKE 1-2 WEEKS TO SCHEDULE/PROCESS THE REFERRAL. IF YOU HAVE NOT HEARD FROM US/SPECIALIST IN TWO WEEKS, PLEASE GIVE [place] A CALL AT [phone].   THE PATIENT IS ENCOURAGED TO PRACTICE SOCIAL DISTANCING DUE TO THE COVID-19 PANDEMIC.

## 2020-11-08 ENCOUNTER — Other Ambulatory Visit: Payer: Federal, State, Local not specified - PPO

## 2020-11-08 DIAGNOSIS — R7303 Prediabetes: Secondary | ICD-10-CM | POA: Diagnosis not present

## 2020-11-08 DIAGNOSIS — E7800 Pure hypercholesterolemia, unspecified: Secondary | ICD-10-CM | POA: Diagnosis not present

## 2020-11-09 LAB — LIPID PANEL
Chol/HDL Ratio: 4.1 ratio (ref 0.0–4.4)
Cholesterol, Total: 226 mg/dL — ABNORMAL HIGH (ref 100–199)
HDL: 55 mg/dL (ref >39)
LDL Chol Calc (NIH): 157 mg/dL — ABNORMAL HIGH (ref 0–99)
Triglycerides: 80 mg/dL (ref 0–149)
VLDL Cholesterol Cal: 14 mg/dL (ref 5–40)

## 2020-11-09 LAB — HEMOGLOBIN A1C
Est. average glucose Bld gHb Est-mCnc: 120 mg/dL
Hgb A1c MFr Bld: 5.8 % — ABNORMAL HIGH (ref 4.8–5.6)

## 2020-11-09 LAB — BMP8+EGFR
BUN/Creatinine Ratio: 24 — ABNORMAL HIGH (ref 9–23)
BUN: 17 mg/dL (ref 6–24)
CO2: 24 mmol/L (ref 20–29)
Calcium: 9.3 mg/dL (ref 8.7–10.2)
Chloride: 100 mmol/L (ref 96–106)
Creatinine, Ser: 0.7 mg/dL (ref 0.57–1.00)
Glucose: 90 mg/dL (ref 65–99)
Potassium: 4.3 mmol/L (ref 3.5–5.2)
Sodium: 139 mmol/L (ref 134–144)
eGFR: 101 mL/min/{1.73_m2} (ref >59)

## 2020-11-11 ENCOUNTER — Telehealth: Payer: Self-pay

## 2020-11-11 NOTE — Telephone Encounter (Signed)
-----   Message from Arnette Felts, [place] sent at 11/09/2020 11:09 PM EDT ----- Your HgbA1c is improved to 5.8 down from 6 this is going in the right direction. Kidney functions are normal except your BUN is elevated this lets [place] know you need to drink more water. Total cholesterol has increased to 226 from 202 and LDL is 157 goal is less than 99 cut back on fried and fatty foods. Also increase your intake of fiber, we will recheck at your next visit if this is still elevated you will need to take a medication to help lower.

## 2020-11-11 NOTE — Telephone Encounter (Signed)
No answer

## 2020-11-16 ENCOUNTER — Other Ambulatory Visit (HOSPITAL_COMMUNITY): Payer: Self-pay

## 2020-11-16 MED FILL — Omeprazole Cap Delayed Release 20 MG: ORAL | 90 days supply | Qty: 90 | Fill #0 | Status: AC

## 2020-11-16 MED FILL — Budesonide-Formoterol Fumarate Dihyd Aerosol 160-4.5 MCG/ACT: RESPIRATORY_TRACT | 60 days supply | Qty: 10.2 | Fill #0 | Status: AC

## 2020-11-16 MED FILL — Albuterol Sulfate Inhal Aero 108 MCG/ACT (90MCG Base Equiv): RESPIRATORY_TRACT | 25 days supply | Qty: 18 | Fill #0 | Status: AC

## 2020-11-16 MED FILL — Montelukast Sodium Tab 10 MG (Base Equiv): ORAL | 90 days supply | Qty: 90 | Fill #0 | Status: AC

## 2020-11-18 ENCOUNTER — Other Ambulatory Visit (HOSPITAL_COMMUNITY): Payer: Self-pay

## 2020-12-20 ENCOUNTER — Telehealth: Payer: Self-pay

## 2020-12-20 NOTE — Telephone Encounter (Signed)
I called pt and was unable to leave her a vm. I was trying to inform her that Dermatology Specialist have contacted her 3 times for an appt and have not heard back. [facility]   Dermatology Specialist  [address] [place] [address] Demetrius Charity [phone]

## 2021-02-09 ENCOUNTER — Other Ambulatory Visit: Payer: Self-pay

## 2021-02-09 ENCOUNTER — Ambulatory Visit: Payer: Federal, State, Local not specified - PPO | Admitting: Nurse Practitioner

## 2021-02-09 ENCOUNTER — Encounter: Payer: Self-pay | Admitting: Nurse Practitioner

## 2021-02-09 VITALS — BP 112/60 | HR 69 | Temp 98.6°F | Ht 61.0 in | Wt 199.4 lb

## 2021-02-09 DIAGNOSIS — Z23 Encounter for immunization: Secondary | ICD-10-CM | POA: Diagnosis not present

## 2021-02-09 DIAGNOSIS — E782 Mixed hyperlipidemia: Secondary | ICD-10-CM | POA: Diagnosis not present

## 2021-02-09 DIAGNOSIS — E6609 Other obesity due to excess calories: Secondary | ICD-10-CM | POA: Diagnosis not present

## 2021-02-09 DIAGNOSIS — R7303 Prediabetes: Secondary | ICD-10-CM | POA: Diagnosis not present

## 2021-02-09 DIAGNOSIS — Z1211 Encounter for screening for malignant neoplasm of colon: Secondary | ICD-10-CM

## 2021-02-09 DIAGNOSIS — R21 Rash and other nonspecific skin eruption: Secondary | ICD-10-CM

## 2021-02-09 DIAGNOSIS — Z6837 Body mass index (BMI) 37.0-37.9, adult: Secondary | ICD-10-CM

## 2021-02-09 LAB — CMP14+EGFR
ALT: 16 IU/L (ref 0–32)
AST: 19 IU/L (ref 0–40)
Albumin/Globulin Ratio: 1.3 (ref 1.2–2.2)
Albumin: 4.2 g/dL (ref 3.8–4.9)
Alkaline Phosphatase: 74 IU/L (ref 44–121)
BUN/Creatinine Ratio: 24 — ABNORMAL HIGH (ref 9–23)
BUN: 19 mg/dL (ref 6–24)
Bilirubin Total: 0.2 mg/dL (ref 0.0–1.2)
CO2: 25 mmol/L (ref 20–29)
Calcium: 9.7 mg/dL (ref 8.7–10.2)
Chloride: 105 mmol/L (ref 96–106)
Creatinine, Ser: 0.78 mg/dL (ref 0.57–1.00)
Globulin, Total: 3.3 g/dL (ref 1.5–4.5)
Glucose: 88 mg/dL (ref 70–99)
Potassium: 5.2 mmol/L (ref 3.5–5.2)
Sodium: 143 mmol/L (ref 134–144)
Total Protein: 7.5 g/dL (ref 6.0–8.5)
eGFR: 89 mL/min/{1.73_m2} (ref >59)

## 2021-02-09 LAB — LIPID PANEL
Chol/HDL Ratio: 4 ratio (ref 0.0–4.4)
Cholesterol, Total: 230 mg/dL — ABNORMAL HIGH (ref 100–199)
HDL: 57 mg/dL (ref >39)
LDL Chol Calc (NIH): 157 mg/dL — ABNORMAL HIGH (ref 0–99)
Triglycerides: 93 mg/dL (ref 0–149)
VLDL Cholesterol Cal: 16 mg/dL (ref 5–40)

## 2021-02-09 LAB — HEMOGLOBIN A1C
Est. average glucose Bld gHb Est-mCnc: 123 mg/dL
Hgb A1c MFr Bld: 5.9 % — ABNORMAL HIGH (ref 4.8–5.6)

## 2021-02-09 MED ORDER — FLUTICASONE PROPIONATE 50 MCG/ACT NA SUSP: 2.0000 | Freq: Every day | NASAL | 2 refills | Status: DC

## 2021-02-09 NOTE — Progress Notes (Signed)
I,Katawbba Wiggins,acting as a Education administrator for [employer], FNP.,have documented all relevant documentation on the behalf of Minette Brine, FNP,as directed by  Minette Brine, FNP while in the presence of Minette Brine, [place].   This visit occurred during the SARS-CoV-2 public health emergency.  Safety protocols were in place, including screening questions prior to the visit, additional usage of staff PPE, and extensive cleaning of exam room while observing appropriate contact time as indicated for disinfecting solutions.  Subjective:     Patient ID: Paula Sparks , female    DOB: 10/24/1965 , 55 y.o.   MRN: [id number]   Chief Complaint  Patient presents with   Prediabetes     HPI  Patient presents today for a prediabetes f/u. She is currently taking rybelsus 40m daily and she is tolerating it well.   Wt Readings from Last 3 Encounters: 02/09/21 : 199 lb 6.4 oz (90.4 kg) 11/07/20 : 207 lb 9.6 oz (94.2 kg) 04/25/20 : 205 lb 6.4 oz (93.2 kg)       Past Medical History:  Diagnosis Date   Cough    GERD (gastroesophageal reflux disease)    Hyperlipidemia    Murmur, heart      Family History  Problem Relation Age of Onset   Hypertension Mother    Heart failure Mother    Heart disease Neg Hx      Current Outpatient Medications:    albuterol (VENTOLIN HFA) 108 (90 Base) MCG/ACT inhaler, INHALE 2 PUFFS INTO THE LUNGS EVERY 6 (SIX) HOURS AS NEEDED FOR WHEEZING OR SHORTNESS OF BREATH., Disp: 18 g, Rfl: 1   aspirin EC 81 MG tablet, Take 81 mg by mouth daily., Disp: , Rfl:    budesonide-formoterol (SYMBICORT) 160-4.5 MCG/ACT inhaler, INHALE 2 PUFFS INTO THE LUNGS DAILY., Disp: 10.2 g, Rfl: 6   cetirizine (ZYRTEC) 10 MG tablet, Take 10 mg by mouth daily., Disp: , Rfl:    fish oil-omega-3 fatty acids 1000 MG capsule, Take 2 g by mouth daily., Disp: , Rfl:    ibuprofen (ADVIL) 600 MG tablet, Take 1 tablet (600 mg total) by mouth every 6 (six) hours as needed., Disp: 30 tablet,  Rfl: 0   meloxicam (MOBIC) 7.5 MG tablet, TAKE 1 TABLET (7.5 MG TOTAL) BY MOUTH DAILY., Disp: 30 tablet, Rfl: 2   montelukast (SINGULAIR) 10 MG tablet, TAKE 1 TABLET (10 MG TOTAL) BY MOUTH DAILY., Disp: 90 tablet, Rfl: 1   Multiple Vitamins-Minerals (MULTIVITAMIN WITH MINERALS) tablet, Take 1 tablet by mouth daily., Disp: , Rfl:    omeprazole (PRILOSEC OTC) 20 MG tablet, Take 1 tablet (20 mg total) by mouth daily., Disp: 90 tablet, Rfl: 1   Semaglutide (RYBELSUS) 14 MG TABS, Take 1 tablet by mouth daily. Take 30 minutes before breakfast, Disp: 90 tablet, Rfl: 1   fluticasone (FLONASE) 50 MCG/ACT nasal spray, Place 2 sprays into both nostrils daily., Disp: 16 g, Rfl: 2   No Known Allergies   Review of Systems  Constitutional: Negative.   Eyes: Negative.   Respiratory: Negative.    Cardiovascular: Negative.  Negative for chest pain, palpitations and leg swelling.  Musculoskeletal: Negative.   Skin: Negative.   Neurological:  Negative for dizziness and headaches.  Psychiatric/Behavioral: Negative.      Today's Vitals   02/09/21 0949  BP: 112/60  Pulse: 69  Temp: 98.6 F (37 C)  TempSrc: Oral  Weight: 199 lb 6.4 oz (90.4 kg)  Height: 5' 1"  (1.549 m)   Body mass index is  37.68 kg/m.  Wt Readings from Last 3 Encounters:  02/09/21 199 lb 6.4 oz (90.4 kg)  11/07/20 207 lb 9.6 oz (94.2 kg)  04/25/20 205 lb 6.4 oz (93.2 kg)    BP Readings from Last 3 Encounters:  02/09/21 112/60  11/07/20 114/78  04/25/20 124/80    Objective:  Physical Exam Vitals reviewed.  Constitutional:      General: She is not in acute distress.    Appearance: Normal appearance. She is obese.  Cardiovascular:     Rate and Rhythm: Normal rate and regular rhythm.     Pulses: Normal pulses.     Heart sounds: Normal heart sounds. No murmur heard. Pulmonary:     Effort: Pulmonary effort is normal. No respiratory distress.     Breath sounds: Normal breath sounds. No wheezing.  Skin:    Findings: No rash  (right foot with hyperpigmented scaly rash).  Neurological:     General: No focal deficit present.     Mental Status: She is alert and oriented to person, place, and time.     Cranial Nerves: No cranial nerve deficit.  Psychiatric:        Mood and Affect: Mood normal.        Behavior: Behavior normal.        Thought Content: Thought content normal.        Judgment: Judgment normal.        Assessment And Plan:     1. Prediabetes Comments: Stable, continue eating a low sugar and carbohydrate diet Will check HgbA1c today - Hemoglobin A1c - CMP14+EGFR  2. Mixed hyperlipidemia - Lipid panel - CMP14+EGFR  3. Rash of foot Comments: Hyperpigmented scaly rash to medial right foot Will refer to Dermatology - Ambulatory referral to Dermatology  4. Immunization due Influenza vaccine administered Encouraged to take Tylenol as needed for fever or muscle aches. - Flu Vaccine QUAD 6+ mos PF IM (Fluarix Quad PF)  5. Class 2 obesity due to excess calories without serious comorbidity with body mass index (BMI) of 37.0 to 37.9 in adult Chronic Discussed healthy diet and regular exercise options  Encouraged to exercise at least 150 minutes per week with 2 days of strength training She is encouraged to strive for BMI less than 30 to decrease cardiac risk. Advised to aim for at least 150 minutes of exercise per week.  6. Encounter for screening colonoscopy According to USPTF Colorectal cancer Screening guidelines. Colonoscopy is recommended every 10 years, starting at age 94years. Will refer to GI for colon cancer screening. - Ambulatory referral to Gastroenterology    Patient was given opportunity to ask questions. Patient verbalized understanding of the plan and was able to repeat key elements of the plan. All questions were answered to their satisfaction.  Minette Brine, FNP   I, Minette Brine, FNP, have reviewed all documentation for this visit. The documentation on 02/09/21 for the exam,  diagnosis, procedures, and orders are all accurate and complete.   IF YOU HAVE BEEN REFERRED TO A SPECIALIST, IT MAY TAKE 1-2 WEEKS TO SCHEDULE/PROCESS THE REFERRAL. IF YOU HAVE NOT HEARD FROM US/SPECIALIST IN TWO WEEKS, PLEASE GIVE [place] A CALL AT [phone].   THE PATIENT IS ENCOURAGED TO PRACTICE SOCIAL DISTANCING DUE TO THE COVID-19 PANDEMIC.

## 2021-02-09 NOTE — Patient Instructions (Signed)

## 2021-02-13 ENCOUNTER — Encounter: Payer: Self-pay | Admitting: Nurse Practitioner

## 2021-03-03 ENCOUNTER — Other Ambulatory Visit (HOSPITAL_COMMUNITY): Payer: Self-pay

## 2021-03-03 DIAGNOSIS — Z23 Encounter for immunization: Secondary | ICD-10-CM | POA: Diagnosis not present

## 2021-03-03 DIAGNOSIS — L821 Other seborrheic keratosis: Secondary | ICD-10-CM | POA: Diagnosis not present

## 2021-03-03 DIAGNOSIS — L301 Dyshidrosis [pompholyx]: Secondary | ICD-10-CM | POA: Diagnosis not present

## 2021-03-03 MED ORDER — TRIAMCINOLONE ACETONIDE 0.1 % EX CREA
1.0000 "application " | TOPICAL_CREAM | Freq: Two times a day (BID) | CUTANEOUS | 0 refills | Status: AC
  Filled 2021-03-03: qty 45, 14d supply, fill #0

## 2021-03-13 ENCOUNTER — Other Ambulatory Visit (HOSPITAL_COMMUNITY): Payer: Self-pay

## 2021-03-19 ENCOUNTER — Other Ambulatory Visit: Payer: Self-pay | Admitting: Nurse Practitioner

## 2021-03-19 DIAGNOSIS — M542 Cervicalgia: Secondary | ICD-10-CM

## 2021-03-19 DIAGNOSIS — M2550 Pain in unspecified joint: Secondary | ICD-10-CM

## 2021-03-20 ENCOUNTER — Other Ambulatory Visit (HOSPITAL_COMMUNITY): Payer: Self-pay

## 2021-03-21 ENCOUNTER — Other Ambulatory Visit (HOSPITAL_COMMUNITY): Payer: Self-pay

## 2021-03-21 MED ORDER — MELOXICAM 7.5 MG PO TABS
7.5000 mg | ORAL_TABLET | Freq: Every day | ORAL | 2 refills | Status: DC
  Filled 2021-03-21 – 2021-03-30 (×2): qty 30, 30d supply, fill #0
  Filled 2021-06-21: qty 30, 30d supply, fill #1

## 2021-03-29 ENCOUNTER — Other Ambulatory Visit (HOSPITAL_COMMUNITY): Payer: Self-pay

## 2021-03-30 ENCOUNTER — Other Ambulatory Visit (HOSPITAL_COMMUNITY): Payer: Self-pay

## 2021-03-30 ENCOUNTER — Other Ambulatory Visit: Payer: Self-pay | Admitting: Nurse Practitioner

## 2021-03-30 MED ORDER — OMEPRAZOLE 20 MG PO CPDR
20.0000 mg | DELAYED_RELEASE_CAPSULE | Freq: Every day | ORAL | 1 refills | Status: DC
  Filled 2021-03-30: qty 90, 90d supply, fill #0
  Filled 2021-06-21: qty 90, 90d supply, fill #1

## 2021-03-30 MED FILL — Budesonide-Formoterol Fumarate Dihyd Aerosol 160-4.5 MCG/ACT: RESPIRATORY_TRACT | 60 days supply | Qty: 10.2 | Fill #1 | Status: AC

## 2021-05-09 DIAGNOSIS — K080 Exfoliation of teeth due to systemic causes: Secondary | ICD-10-CM | POA: Diagnosis not present

## 2021-05-11 ENCOUNTER — Other Ambulatory Visit: Payer: Self-pay

## 2021-05-11 ENCOUNTER — Ambulatory Visit (INDEPENDENT_AMBULATORY_CARE_PROVIDER_SITE_OTHER): Payer: Federal, State, Local not specified - PPO | Admitting: Nurse Practitioner

## 2021-05-11 ENCOUNTER — Encounter: Payer: Self-pay | Admitting: Nurse Practitioner

## 2021-05-11 ENCOUNTER — Other Ambulatory Visit (HOSPITAL_COMMUNITY)
Admission: RE | Admit: 2021-05-11 | Discharge: 2021-05-11 | Disposition: A | Discharge status: Home / Self Care | Payer: Federal, State, Local not specified - PPO | Source: Ambulatory Visit | Attending: Nurse Practitioner | Admitting: Nurse Practitioner

## 2021-05-11 VITALS — BP 120/70 | HR 77 | Temp 98.7°F | Ht 61.2 in | Wt 203.0 lb

## 2021-05-11 DIAGNOSIS — R7303 Prediabetes: Secondary | ICD-10-CM | POA: Insufficient documentation

## 2021-05-11 DIAGNOSIS — E782 Mixed hyperlipidemia: Secondary | ICD-10-CM | POA: Diagnosis not present

## 2021-05-11 DIAGNOSIS — Z0000 Encounter for general adult medical examination without abnormal findings: Secondary | ICD-10-CM | POA: Diagnosis not present

## 2021-05-11 DIAGNOSIS — Z23 Encounter for immunization: Secondary | ICD-10-CM

## 2021-05-11 DIAGNOSIS — Z6838 Body mass index (BMI) 38.0-38.9, adult: Secondary | ICD-10-CM | POA: Insufficient documentation

## 2021-05-11 DIAGNOSIS — E559 Vitamin D deficiency, unspecified: Secondary | ICD-10-CM | POA: Diagnosis not present

## 2021-05-11 DIAGNOSIS — E6609 Other obesity due to excess calories: Secondary | ICD-10-CM | POA: Diagnosis not present

## 2021-05-11 DIAGNOSIS — E669 Obesity, unspecified: Secondary | ICD-10-CM | POA: Insufficient documentation

## 2021-05-11 NOTE — Progress Notes (Signed)
I,Tianna Badgett,acting as a Education administrator for [employer], NP.,have documented all relevant documentation on the behalf of [employer], NP,as directed by  Bary Castilla, NP while in the presence of Bary Castilla, NP.  This visit occurred during the SARS-CoV-2 public health emergency.  Safety protocols were in place, including screening questions prior to the visit, additional usage of staff PPE, and extensive cleaning of exam room while observing appropriate contact time as indicated for disinfecting solutions.  Subjective:     Patient ID: Paula Sparks , female    DOB: 02-11-1966 , 55 y.o.   MRN: [id number]   Chief Complaint  Patient presents with   Annual Exam    HPI  Patient is here for HM. She has not had a mammogram this year. She went to the dermatologist for eczema. She will get a mammogram. She will schedule one at the breast center. She is going to do a papsmear today. She has no other concerns.     Past Medical History:  Diagnosis Date   Cough    GERD (gastroesophageal reflux disease)    Hyperlipidemia    Murmur, heart      Family History  Problem Relation Age of Onset   Hypertension Mother    Heart failure Mother    Heart disease Neg Hx      Current Outpatient Medications:    albuterol (VENTOLIN HFA) 108 (90 Base) MCG/ACT inhaler, INHALE 2 PUFFS INTO THE LUNGS EVERY 6 (SIX) HOURS AS NEEDED FOR WHEEZING OR SHORTNESS OF BREATH., Disp: 18 g, Rfl: 1   aspirin EC 81 MG tablet, Take 81 mg by mouth daily., Disp: , Rfl:    budesonide-formoterol (SYMBICORT) 160-4.5 MCG/ACT inhaler, INHALE 2 PUFFS INTO THE LUNGS DAILY., Disp: 10.2 g, Rfl: 6   cetirizine (ZYRTEC) 10 MG tablet, Take 10 mg by mouth daily., Disp: , Rfl:    fish oil-omega-3 fatty acids 1000 MG capsule, Take 2 g by mouth daily., Disp: , Rfl:    fluticasone (FLONASE) 50 MCG/ACT nasal spray, Place 2 sprays into both nostrils daily., Disp: 16 g, Rfl: 2   ibuprofen (ADVIL) 600 MG tablet, Take  1 tablet (600 mg total) by mouth every 6 (six) hours as needed., Disp: 30 tablet, Rfl: 0   meloxicam (MOBIC) 7.5 MG tablet, Take 1 tablet (7.5 mg total) by mouth daily., Disp: 30 tablet, Rfl: 2   Multiple Vitamins-Minerals (MULTIVITAMIN WITH MINERALS) tablet, Take 1 tablet by mouth daily., Disp: , Rfl:    omeprazole (PRILOSEC OTC) 20 MG tablet, Take 1 tablet (20 mg total) by mouth daily., Disp: 90 tablet, Rfl: 1   omeprazole (PRILOSEC) 20 MG capsule, Take 1 capsule (20 mg total) by mouth daily., Disp: 90 capsule, Rfl: 1   Semaglutide (RYBELSUS) 14 MG TABS, Take 1 tablet by mouth daily. Take 30 minutes before breakfast, Disp: 90 tablet, Rfl: 1   montelukast (SINGULAIR) 10 MG tablet, TAKE 1 TABLET (10 MG TOTAL) BY MOUTH DAILY., Disp: 90 tablet, Rfl: 1   No Known Allergies    The patient states she uses none for birth control. Last LMP was Patient's last menstrual period was 03/04/2015.. Negative for Dysmenorrhea. Negative for: breast discharge, breast lump(s), breast pain and breast self exam. Associated symptoms include abnormal vaginal bleeding. Pertinent negatives include abnormal bleeding (hematology), anxiety, decreased libido, depression, difficulty falling sleep, dyspareunia, history of infertility, nocturia, sexual dysfunction, sleep disturbances, urinary incontinence, urinary urgency, vaginal discharge and vaginal itching. Diet regular.The patient states her exercise level is    .  The patient's tobacco use is:  Social History   Tobacco Use  Smoking Status Never  Smokeless Tobacco Never  . She has been exposed to passive smoke. The patient's alcohol use is:  Social History   Substance and Sexual Activity  Alcohol Use No  . Additional information: Last pap 2022, next one scheduled for 2025.    Review of Systems  Constitutional:  Negative for chills and fever.  HENT: Negative.  Negative for congestion and rhinorrhea.   Eyes: Negative.   Respiratory: Negative.  Negative for cough,  shortness of breath and wheezing.   Cardiovascular: Negative.  Negative for chest pain and palpitations.  Gastrointestinal: Negative.  Negative for constipation, diarrhea, nausea and vomiting.  Endocrine: Negative.  Negative for polydipsia, polyphagia and polyuria.  Genitourinary: Negative.   Musculoskeletal: Negative.  Negative for arthralgias and myalgias.  Skin: Negative.   Allergic/Immunologic: Negative.   Neurological: Negative.  Negative for dizziness, weakness and numbness.  Hematological: Negative.   Psychiatric/Behavioral: Negative.      Today's Vitals   05/11/21 1021  BP: 120/70  Pulse: 77  Temp: 98.7 F (37.1 C)  TempSrc: Oral  Weight: 203 lb (92.1 kg)  Height: 5' 1.2" (1.554 m)   Body mass index is 38.11 kg/m.   Objective:  Physical Exam Vitals and nursing note reviewed.  Constitutional:      Appearance: Normal appearance. She is obese.  HENT:     Head: Normocephalic and atraumatic.     Right Ear: Tympanic membrane, ear canal and external ear normal. There is no impacted cerumen.     Left Ear: Tympanic membrane, ear canal and external ear normal. There is no impacted cerumen.     Nose: Nose normal. No congestion or rhinorrhea.     Mouth/Throat:     Mouth: Mucous membranes are dry.     Pharynx: Oropharynx is clear.  Eyes:     Extraocular Movements: Extraocular movements intact.     Conjunctiva/sclera: Conjunctivae normal.     Pupils: Pupils are equal, round, and reactive to light.  Cardiovascular:     Rate and Rhythm: Normal rate and regular rhythm.     Pulses: Normal pulses.     Heart sounds: Normal heart sounds. No murmur heard. Pulmonary:     Effort: Pulmonary effort is normal. No respiratory distress.     Breath sounds: Normal breath sounds. No wheezing.  Abdominal:     General: Abdomen is flat. Bowel sounds are normal.     Palpations: Abdomen is soft.  Genitourinary:    General: Normal vulva.     Vagina: No vaginal discharge.     Rectum: Normal.   Musculoskeletal:        General: Normal range of motion.     Cervical back: Normal range of motion and neck supple.  Skin:    General: Skin is warm and dry.     Capillary Refill: Capillary refill takes less than 2 seconds.  Neurological:     General: No focal deficit present.     Mental Status: She is alert and oriented to person, place, and time.  Psychiatric:        Mood and Affect: Mood normal.        Behavior: Behavior normal.        Assessment And Plan:     1. Encounter for annual physical exam --Patient is here for their annual physical exam and we discussed any changes to medication and medical history.  -Behavior modification was discussed as  well as diet and exercise history  -Patient will continue to exercise regularly and modify their diet.  -Recommendation for yearly physical annuals, immunization and screenings including mammogram and colonoscopy were discussed with the patient.  -Recommended intake of multivitamin, vitamin D and calcium.  -Individualized advise was given to the patient pertaining to their own health history in regards to diet, exercise, medical condition and referrals.  - Cytology -Pap Smear  2. Mixed hyperlipidemia --Educated patient about a diet that is low in fat and high fatty foods including dairy products. Increase in take of fish and fiber. Decrease intake of red meats and fast foods. Exercise for atleast 4-5 times a week or atleast 30-45 min. Drink a lot of water.   - Lipid panel  3. Vitamin D deficiency -Will check and supplement if needed. Advised patient to spend atleast 15 min. Daily in sunlight.  - Vitamin D (25 hydroxy)  4. Prediabetes --Discussed with patient the importance of glycemic control and long term complications from uncontrolled diabetes. Discussed with the patient the importance of compliance with home glucose monitoring, diet which includes decrease amount of sugary drinks and foods. Importance of exercise was also  discussed with the patient. Importance of eye exams, self foot care and compliance to office visits was also discussed with the patient.  - CBC - Hemoglobin A1c - CMP14+EGFR  5. Need for Tdap vaccination - Tdap vaccine greater than or equal to 7yo IM  6. Class 2 obesity due to excess calories without serious comorbidity with body mass index (BMI) of 38.0 to 38.9 in adult -Advised patient on a healthy diet including avoiding fast food and red meats. Increase the intake of lean meats including grilled chicken and [place].  Drink a lot of water. Decrease intake of fatty foods. Exercise for 30-45 min. 4-5 a week to decrease the risk of cardiac event.   Follow up: if symptoms persist or do not get better.   The patient was encouraged to call or send a message through [place] for any questions or concerns.   Side effects and appropriate use of all the medication(s) were discussed with the patient today. Patient advised to use the medication(s) as directed by their healthcare provider. The patient was encouraged to read, review, and understand all associated package inserts and contact our office with any questions or concerns. The patient accepts the risks of the treatment plan and had an opportunity to ask questions.   Side effects and appropriate use of all the medication(s) were discussed with the patient today. Patient advised to use the medication(s) as directed by their healthcare provider. The patient was encouraged to read, review, and understand all associated package inserts and contact our office with any questions or concerns. The patient accepts the risks of the treatment plan and had an opportunity to ask questions.   Staying healthy and adopting a healthy lifestyle for your overall health is important. You should eat 7 or more servings of fruits and vegetables per day. You should drink plenty of water to keep yourself hydrated and your kidneys healthy. This includes about 65-80+ fluid  ounces of water. Limit your intake of animal fats especially for elevated cholesterol. Avoid highly processed food and limit your salt intake if you have hypertension. Avoid foods high in saturated/Trans fats. Along with a healthy diet it is also very important to maintain time for yourself to maintain a healthy mental health with low stress levels. You should get atleast 150 min of moderate intensity exercise  weekly for a healthy heart. Along with eating right and exercising, aim for at least 7-9 hours of sleep daily.  Eat more whole grains which includes barley, wheat berries, oats, brown rice and whole wheat pasta. Use healthy plant oils which include olive, soy, corn, sunflower and peanut. Limit your caffeine and sugary drinks. Limit your intake of fast foods. Limit milk and dairy products to one or two daily servings.   Patient was given opportunity to ask questions. Patient verbalized understanding of the plan and was able to repeat key elements of the plan. All questions were answered to their satisfaction.  Paula Jadriel Saxer, DNP   I, Paula Sparks have reviewed all documentation for this visit. The documentation on 05/11/21 for the exam, diagnosis, procedures, and orders are all accurate and complete.   THE PATIENT IS ENCOURAGED TO PRACTICE SOCIAL DISTANCING DUE TO THE COVID-19 PANDEMIC.

## 2021-05-11 NOTE — Patient Instructions (Signed)

## 2021-05-12 LAB — CMP14+EGFR
ALT: 49 IU/L — ABNORMAL HIGH (ref 0–32)
AST: 59 IU/L — ABNORMAL HIGH (ref 0–40)
Albumin/Globulin Ratio: 1.1 — ABNORMAL LOW (ref 1.2–2.2)
Albumin: 4 g/dL (ref 3.8–4.9)
Alkaline Phosphatase: 81 IU/L (ref 44–121)
BUN/Creatinine Ratio: 25 — ABNORMAL HIGH (ref 9–23)
BUN: 19 mg/dL (ref 6–24)
Bilirubin Total: 0.3 mg/dL (ref 0.0–1.2)
CO2: 27 mmol/L (ref 20–29)
Calcium: 9.6 mg/dL (ref 8.7–10.2)
Chloride: 103 mmol/L (ref 96–106)
Creatinine, Ser: 0.76 mg/dL (ref 0.57–1.00)
Globulin, Total: 3.5 g/dL (ref 1.5–4.5)
Glucose: 86 mg/dL (ref 70–99)
Potassium: 4.6 mmol/L (ref 3.5–5.2)
Sodium: 141 mmol/L (ref 134–144)
Total Protein: 7.5 g/dL (ref 6.0–8.5)
eGFR: 91 mL/min/{1.73_m2} (ref >59)

## 2021-05-12 LAB — LIPID PANEL
Chol/HDL Ratio: 4.2 ratio (ref 0.0–4.4)
Cholesterol, Total: 245 mg/dL — ABNORMAL HIGH (ref 100–199)
HDL: 58 mg/dL (ref >39)
LDL Chol Calc (NIH): 174 mg/dL — ABNORMAL HIGH (ref 0–99)
Triglycerides: 77 mg/dL (ref 0–149)
VLDL Cholesterol Cal: 13 mg/dL (ref 5–40)

## 2021-05-12 LAB — CBC
Hematocrit: 37.4 % (ref 34.0–46.6)
Hemoglobin: 12.1 g/dL (ref 11.1–15.9)
MCH: 26.4 pg — ABNORMAL LOW (ref 26.6–33.0)
MCHC: 32.4 g/dL (ref 31.5–35.7)
MCV: 82 fL (ref 79–97)
Platelets: 316 10*3/uL (ref 150–450)
RBC: 4.58 x10E6/uL (ref 3.77–5.28)
RDW: 13.8 % (ref 11.7–15.4)
WBC: 4.9 10*3/uL (ref 3.4–10.8)

## 2021-05-12 LAB — HEMOGLOBIN A1C
Est. average glucose Bld gHb Est-mCnc: 126 mg/dL
Hgb A1c MFr Bld: 6 % — ABNORMAL HIGH (ref 4.8–5.6)

## 2021-05-12 LAB — VITAMIN D 25 HYDROXY (VIT D DEFICIENCY, FRACTURES): Vit D, 25-Hydroxy: 31.1 ng/mL (ref 30.0–100.0)

## 2021-05-17 ENCOUNTER — Other Ambulatory Visit: Payer: Self-pay | Admitting: Nurse Practitioner

## 2021-05-17 DIAGNOSIS — E782 Mixed hyperlipidemia: Secondary | ICD-10-CM

## 2021-05-17 MED ORDER — ATORVASTATIN CALCIUM 10 MG PO TABS
10.0000 mg | ORAL_TABLET | Freq: Every day | ORAL | 2 refills | Status: DC
Start: 2021-05-17 — End: 2022-05-23

## 2021-05-19 LAB — CYTOLOGY - PAP
Chlamydia: NEGATIVE
Comment: NEGATIVE
Comment: NEGATIVE
Comment: NEGATIVE
Comment: NORMAL
Diagnosis: UNDETERMINED — AB
High risk HPV: NEGATIVE
Neisseria Gonorrhea: NEGATIVE
Trichomonas: NEGATIVE

## 2021-06-21 ENCOUNTER — Other Ambulatory Visit: Payer: Self-pay | Admitting: Nurse Practitioner

## 2021-06-21 ENCOUNTER — Other Ambulatory Visit (HOSPITAL_COMMUNITY): Payer: Self-pay

## 2021-06-21 DIAGNOSIS — Z8709 Personal history of other diseases of the respiratory system: Secondary | ICD-10-CM

## 2021-06-21 DIAGNOSIS — R7303 Prediabetes: Secondary | ICD-10-CM

## 2021-06-21 MED ORDER — RYBELSUS 14 MG PO TABS
1.0000 | ORAL_TABLET | Freq: Every day | ORAL | 1 refills | Status: DC
  Filled 2021-06-21: qty 90, 90d supply, fill #0
  Filled 2021-09-19: qty 90, 90d supply, fill #1

## 2021-06-21 MED ORDER — BUDESONIDE-FORMOTEROL FUMARATE 160-4.5 MCG/ACT IN AERO
2.0000 | INHALATION_SPRAY | Freq: Every day | RESPIRATORY_TRACT | 6 refills | Status: DC
  Filled 2021-06-21: qty 10.2, 60d supply, fill #0
  Filled 2021-09-19: qty 10.2, 60d supply, fill #1
  Filled 2021-12-31: qty 10.2, 60d supply, fill #2

## 2021-06-21 MED ORDER — MONTELUKAST SODIUM 10 MG PO TABS
10.0000 mg | ORAL_TABLET | Freq: Every day | ORAL | 1 refills | Status: DC
  Filled 2021-06-21: qty 90, 90d supply, fill #0

## 2021-06-22 ENCOUNTER — Encounter: Payer: Self-pay | Admitting: Nurse Practitioner

## 2021-06-22 ENCOUNTER — Telehealth (INDEPENDENT_AMBULATORY_CARE_PROVIDER_SITE_OTHER): Payer: Federal, State, Local not specified - PPO | Admitting: Nurse Practitioner

## 2021-06-22 DIAGNOSIS — R051 Acute cough: Secondary | ICD-10-CM | POA: Diagnosis not present

## 2021-06-22 MED ORDER — AMOXICILLIN 875 MG PO TABS: 875.0000 mg | ORAL_TABLET | Freq: Two times a day (BID) | ORAL | 0 refills | Status: DC

## 2021-06-22 NOTE — Patient Instructions (Signed)

## 2021-06-22 NOTE — Progress Notes (Signed)
Virtual Visit via MyChart   This visit type was conducted due to national recommendations for restrictions regarding the COVID-19 Pandemic (e.g. social distancing) in an effort to limit this patient's exposure and mitigate transmission in our community.  Due to her co-morbid illnesses, this patient is at least at moderate risk for complications without adequate follow up.  This format is felt to be most appropriate for this patient at this time.  All issues noted in this document were discussed and addressed.  A limited physical exam was performed with this format.    This visit type was conducted due to national recommendations for restrictions regarding the COVID-19 Pandemic (e.g. social distancing) in an effort to limit this patient's exposure and mitigate transmission in our community.  Patients identity confirmed using two different identifiers.  This format is felt to be most appropriate for this patient at this time.  All issues noted in this document were discussed and addressed.  No physical exam was performed (except for noted visual exam findings with Video Visits).    Date:  06/22/2021   ID:  Paula Sparks, DOB 07-20-1965, MRN [id number]  Patient Location:  Home - spoke with [name]  Provider location:   Office    Chief Complaint:  persistent cough  History of Present Illness:    Paula Sparks is a 56 y.o. female who presents via video conferencing for a telehealth visit today.    The patient does have symptoms concerning for COVID-19 infection (fever, chills, cough, or new shortness of breath).   Patient presents today for treatment for cough. Started in December had a cough. Her sputum is now tan in color. She is going to see her mother in [place] on Monday. Denies fever. She has taken a covid test   Cough This is a new problem. The current episode started more than 1 month ago. Pertinent negatives include no chest pain, chills,  headaches, postnasal drip or shortness of breath. Nothing aggravates the symptoms. She has tried ipratropium inhaler for the symptoms.    Past Medical History:  Diagnosis Date   Cough    GERD (gastroesophageal reflux disease)    Hyperlipidemia    Murmur, heart    Past Surgical History:  Procedure Laterality Date   BREAST LUMPECTOMY  2003   CESAREAN SECTION     NASAL SINUS SURGERY       Current Meds  Medication Sig   amoxicillin (AMOXIL) 875 MG tablet Take 1 tablet (875 mg total) by mouth 2 (two) times daily.     Allergies:   Patient has no known allergies.   Social History   Tobacco Use   Smoking status: Never   Smokeless tobacco: Never  Vaping Use   Vaping Use: Never used  Substance Use Topics   Alcohol use: No   Drug use: No     Family Hx: The patient's family history includes Heart failure in her mother; Hypertension in her mother. There is no history of Heart disease.  ROS:   Please see the history of present illness.    Review of Systems  Constitutional:  Negative for chills.  HENT:  Negative for postnasal drip.   Respiratory:  Positive for cough. Negative for shortness of breath.   Cardiovascular:  Negative for chest pain.  Neurological:  Negative for dizziness and headaches.  Psychiatric/Behavioral: Negative.     All other systems reviewed and are negative.   Labs/Other Tests and Data Reviewed:    Recent Labs:  05/11/2021: ALT 49; BUN 19; Creatinine, Ser 0.76; Hemoglobin 12.1; Platelets 316; Potassium 4.6; Sodium 141   Recent Lipid Panel Lab Results  Component Value Date/Time   CHOL 245 (H) 05/11/2021 11:06 AM   TRIG 77 05/11/2021 11:06 AM   HDL 58 05/11/2021 11:06 AM   CHOLHDL 4.2 05/11/2021 11:06 AM   LDLCALC 174 (H) 05/11/2021 11:06 AM    Wt Readings from Last 3 Encounters:  05/11/21 203 lb (92.1 kg)  02/09/21 199 lb 6.4 oz (90.4 kg)  11/07/20 207 lb 9.6 oz (94.2 kg)     Exam:    Vital Signs:  LMP 03/04/2015     Physical  Exam Vitals reviewed.  Constitutional:      General: She is not in acute distress.    Appearance: Normal appearance.  Pulmonary:     Effort: Pulmonary effort is normal. No respiratory distress.  Neurological:     General: No focal deficit present.     Mental Status: She is alert and oriented to person, place, and time. Mental status is at baseline.     Cranial Nerves: No cranial nerve deficit.  Psychiatric:        Mood and Affect: Mood and affect normal.        Behavior: Behavior normal.        Thought Content: Thought content normal.        Cognition and Memory: Memory normal.        Judgment: Judgment normal.    ASSESSMENT & PLAN:    1. Acute cough She has taken several covid test which are all negative. Encouraged to continue using her albuterol inhaler and to use her rescue inhaler as needed. Will obtain cxr and treat empirically for URI.  - DG Chest 2 View; Future - amoxicillin (AMOXIL) 875 MG tablet; Take 1 tablet (875 mg total) by mouth 2 (two) times daily.  Dispense: 14 tablet; Refill: 0   COVID-19 Education: The signs and symptoms of COVID-19 were discussed with the patient and how to seek care for testing (follow up with PCP or arrange E-visit).  The importance of social distancing was discussed today.  Patient Risk:   After full review of this patients clinical status, I feel that they are at least moderate risk at this time.  Time:   Today, I have spent 7 minutes/ seconds with the patient with telehealth technology discussing above diagnoses.     Medication Adjustments/Labs and Tests Ordered: Current medicines are reviewed at length with the patient today.  Concerns regarding medicines are outlined above.   Tests Ordered: Orders Placed This Encounter  Procedures   DG Chest 2 View    Medication Changes: Meds ordered this encounter  Medications   amoxicillin (AMOXIL) 875 MG tablet    Sig: Take 1 tablet (875 mg total) by mouth 2 (two) times daily.     Dispense:  14 tablet    Refill:  0    Disposition:  Follow up prn  Signed, Arnette Felts, FNP

## 2021-06-23 ENCOUNTER — Ambulatory Visit
Admission: RE | Admit: 2021-06-23 | Discharge: 2021-06-23 | Disposition: A | Discharge status: Home / Self Care | Payer: Federal, State, Local not specified - PPO | Source: Ambulatory Visit | Attending: Nurse Practitioner | Admitting: Nurse Practitioner

## 2021-06-23 DIAGNOSIS — R051 Acute cough: Secondary | ICD-10-CM

## 2021-06-23 DIAGNOSIS — R059 Cough, unspecified: Secondary | ICD-10-CM | POA: Diagnosis not present

## 2021-06-26 ENCOUNTER — Other Ambulatory Visit: Payer: Self-pay | Admitting: Nurse Practitioner

## 2021-06-26 ENCOUNTER — Other Ambulatory Visit (HOSPITAL_COMMUNITY): Payer: Self-pay

## 2021-06-26 DIAGNOSIS — Z8709 Personal history of other diseases of the respiratory system: Secondary | ICD-10-CM

## 2021-06-26 MED ORDER — ALBUTEROL SULFATE HFA 108 (90 BASE) MCG/ACT IN AERS
2.0000 | INHALATION_SPRAY | Freq: Four times a day (QID) | RESPIRATORY_TRACT | 1 refills | Status: DC | PRN
  Filled 2021-06-26: qty 18, 25d supply, fill #0

## 2021-07-04 ENCOUNTER — Other Ambulatory Visit (HOSPITAL_COMMUNITY): Payer: Self-pay

## 2021-07-17 ENCOUNTER — Telehealth: Payer: Federal, State, Local not specified - PPO | Admitting: Nurse Practitioner

## 2021-07-18 ENCOUNTER — Encounter: Payer: Self-pay | Admitting: Nurse Practitioner

## 2021-08-18 ENCOUNTER — Ambulatory Visit
Admission: RE | Admit: 2021-08-18 | Discharge: 2021-08-18 | Disposition: A | Discharge status: Home / Self Care | Payer: Federal, State, Local not specified - PPO | Source: Ambulatory Visit | Attending: Nurse Practitioner | Admitting: Nurse Practitioner

## 2021-08-18 ENCOUNTER — Inpatient Hospital Stay: Admission: RE | Admit: 2021-08-18 | Payer: Federal, State, Local not specified - PPO | Source: Ambulatory Visit

## 2021-08-18 DIAGNOSIS — Z1231 Encounter for screening mammogram for malignant neoplasm of breast: Secondary | ICD-10-CM | POA: Diagnosis not present

## 2021-08-22 ENCOUNTER — Other Ambulatory Visit: Payer: Self-pay | Admitting: Nurse Practitioner

## 2021-08-22 DIAGNOSIS — R928 Other abnormal and inconclusive findings on diagnostic imaging of breast: Secondary | ICD-10-CM

## 2021-09-19 ENCOUNTER — Other Ambulatory Visit: Payer: Federal, State, Local not specified - PPO

## 2021-09-19 ENCOUNTER — Other Ambulatory Visit (HOSPITAL_COMMUNITY): Payer: Self-pay

## 2021-09-19 ENCOUNTER — Ambulatory Visit
Admission: RE | Admit: 2021-09-19 | Discharge: 2021-09-19 | Disposition: A | Discharge status: Home / Self Care | Payer: Federal, State, Local not specified - PPO | Source: Ambulatory Visit | Attending: Nurse Practitioner | Admitting: Nurse Practitioner

## 2021-09-19 DIAGNOSIS — R928 Other abnormal and inconclusive findings on diagnostic imaging of breast: Secondary | ICD-10-CM

## 2021-11-09 ENCOUNTER — Ambulatory Visit: Payer: Federal, State, Local not specified - PPO | Admitting: Nurse Practitioner

## 2022-01-01 ENCOUNTER — Other Ambulatory Visit (HOSPITAL_COMMUNITY): Payer: Self-pay

## 2022-03-23 ENCOUNTER — Other Ambulatory Visit (HOSPITAL_COMMUNITY): Payer: Self-pay

## 2022-03-23 MED ORDER — SANTYL 250 UNIT/GM EX OINT
TOPICAL_OINTMENT | CUTANEOUS | 0 refills | Status: DC
  Filled 2022-03-23: qty 30, 10d supply, fill #0

## 2022-05-23 ENCOUNTER — Other Ambulatory Visit (HOSPITAL_COMMUNITY): Payer: Self-pay

## 2022-05-23 ENCOUNTER — Other Ambulatory Visit: Payer: Self-pay

## 2022-05-23 ENCOUNTER — Ambulatory Visit (INDEPENDENT_AMBULATORY_CARE_PROVIDER_SITE_OTHER): Payer: Federal, State, Local not specified - PPO | Admitting: Nurse Practitioner

## 2022-05-23 ENCOUNTER — Encounter: Payer: Self-pay | Admitting: Nurse Practitioner

## 2022-05-23 VITALS — BP 120/60 | HR 58 | Temp 98.5°F | Ht 61.2 in | Wt 208.0 lb

## 2022-05-23 DIAGNOSIS — Z0001 Encounter for general adult medical examination with abnormal findings: Secondary | ICD-10-CM | POA: Diagnosis not present

## 2022-05-23 DIAGNOSIS — R0789 Other chest pain: Secondary | ICD-10-CM | POA: Diagnosis not present

## 2022-05-23 DIAGNOSIS — E559 Vitamin D deficiency, unspecified: Secondary | ICD-10-CM | POA: Diagnosis not present

## 2022-05-23 DIAGNOSIS — R232 Flushing: Secondary | ICD-10-CM

## 2022-05-23 DIAGNOSIS — M79672 Pain in left foot: Secondary | ICD-10-CM

## 2022-05-23 DIAGNOSIS — E782 Mixed hyperlipidemia: Secondary | ICD-10-CM | POA: Diagnosis not present

## 2022-05-23 DIAGNOSIS — Z79899 Other long term (current) drug therapy: Secondary | ICD-10-CM

## 2022-05-23 DIAGNOSIS — R7303 Prediabetes: Secondary | ICD-10-CM

## 2022-05-23 DIAGNOSIS — E6609 Other obesity due to excess calories: Secondary | ICD-10-CM | POA: Diagnosis not present

## 2022-05-23 DIAGNOSIS — Z1231 Encounter for screening mammogram for malignant neoplasm of breast: Secondary | ICD-10-CM | POA: Diagnosis not present

## 2022-05-23 DIAGNOSIS — Z0000 Encounter for general adult medical examination without abnormal findings: Secondary | ICD-10-CM

## 2022-05-23 DIAGNOSIS — Z6839 Body mass index (BMI) 39.0-39.9, adult: Secondary | ICD-10-CM

## 2022-05-23 DIAGNOSIS — Z8709 Personal history of other diseases of the respiratory system: Secondary | ICD-10-CM

## 2022-05-23 LAB — CBC
Hematocrit: 38.4 % (ref 34.0–46.6)
Hemoglobin: 12.5 g/dL (ref 11.1–15.9)
MCH: 27 pg (ref 26.6–33.0)
MCHC: 32.6 g/dL (ref 31.5–35.7)
MCV: 83 fL (ref 79–97)
Platelets: 342 10*3/uL (ref 150–450)
RBC: 4.63 x10E6/uL (ref 3.77–5.28)
RDW: 14 % (ref 11.7–15.4)
WBC: 6.1 10*3/uL (ref 3.4–10.8)

## 2022-05-23 MED ORDER — OMEPRAZOLE 20 MG PO CPDR
20.0000 mg | DELAYED_RELEASE_CAPSULE | Freq: Every day | ORAL | 1 refills | Status: DC
  Filled 2022-05-23: qty 90, 90d supply, fill #0
  Filled 2022-09-20: qty 90, 90d supply, fill #1

## 2022-05-23 MED ORDER — BUDESONIDE-FORMOTEROL FUMARATE 160-4.5 MCG/ACT IN AERO
2.0000 | INHALATION_SPRAY | Freq: Every day | RESPIRATORY_TRACT | 6 refills | Status: DC
  Filled 2022-05-23: qty 10.2, 60d supply, fill #0
  Filled 2022-07-25 – 2022-09-20 (×2): qty 10.2, 60d supply, fill #1

## 2022-05-23 MED ORDER — PAROXETINE HCL 10 MG PO TABS
10.0000 mg | ORAL_TABLET | Freq: Every day | ORAL | 2 refills | Status: DC
  Filled 2022-05-23: qty 30, 30d supply, fill #0
  Filled 2022-07-09: qty 30, 30d supply, fill #1
  Filled 2022-09-20: qty 30, 30d supply, fill #2

## 2022-05-23 MED ORDER — ATORVASTATIN CALCIUM 10 MG PO TABS
10.0000 mg | ORAL_TABLET | Freq: Every day | ORAL | 2 refills | Status: DC
  Filled 2022-05-23: qty 30, 30d supply, fill #0
  Filled 2022-07-25: qty 30, 30d supply, fill #1
  Filled 2022-09-20: qty 30, 30d supply, fill #2

## 2022-05-23 MED ORDER — MONTELUKAST SODIUM 10 MG PO TABS
10.0000 mg | ORAL_TABLET | Freq: Every day | ORAL | 1 refills | Status: DC
  Filled 2022-05-23: qty 90, 90d supply, fill #0
  Filled 2022-09-20: qty 90, 90d supply, fill #1

## 2022-05-23 NOTE — Patient Instructions (Signed)

## 2022-05-23 NOTE — Progress Notes (Signed)
I,Tianna Badgett,acting as a Neurosurgeon for [employer], FNP.,have documented all relevant documentation on the behalf of Arnette Felts, FNP,as directed by  Arnette Felts, FNP while in the presence of Arnette Felts, FNP.   Subjective:     Patient ID: Paula Sparks , female    DOB: 1966-02-08 , 57 y.o.   MRN: [id number]   Chief Complaint  Patient presents with   Annual Exam    HPI  Patient is here for HM.  Wt Readings from Last 3 Encounters: 05/23/22 : 208 lb (94.3 kg) 05/11/21 : 203 lb (92.1 kg) 02/09/21 : 199 lb 6.4 oz (90.4 kg)       Past Medical History:  Diagnosis Date   Cough    GERD (gastroesophageal reflux disease)    Hyperlipidemia    Murmur, heart      Family History  Problem Relation Age of Onset   Hypertension Mother    Heart failure Mother    Heart disease Neg Hx    Breast cancer Neg Hx      Current Outpatient Medications:    albuterol (VENTOLIN HFA) 108 (90 Base) MCG/ACT inhaler, INHALE 2 PUFFS INTO THE LUNGS EVERY 6 (SIX) HOURS AS NEEDED FOR WHEEZING OR SHORTNESS OF BREATH., Disp: 18 g, Rfl: 1   aspirin EC 81 MG tablet, Take 81 mg by mouth daily., Disp: , Rfl:    cetirizine (ZYRTEC) 10 MG tablet, Take 10 mg by mouth daily., Disp: , Rfl:    collagenase (SANTYL) 250 UNIT/GM ointment, Apply topically daily for 10 days, Disp: 30 g, Rfl: 0   fish oil-omega-3 fatty acids 1000 MG capsule, Take 2 g by mouth daily., Disp: , Rfl:    fluticasone (FLONASE) 50 MCG/ACT nasal spray, Place 2 sprays into both nostrils daily., Disp: 16 g, Rfl: 2   ibuprofen (ADVIL) 600 MG tablet, Take 1 tablet (600 mg total) by mouth every 6 (six) hours as needed., Disp: 30 tablet, Rfl: 0   meloxicam (MOBIC) 7.5 MG tablet, Take 1 tablet (7.5 mg total) by mouth daily., Disp: 30 tablet, Rfl: 2   Multiple Vitamins-Minerals (MULTIVITAMIN WITH MINERALS) tablet, Take 1 tablet by mouth daily., Disp: , Rfl:    PARoxetine (PAXIL) 10 MG tablet, Take 1 tablet (10 mg total) by mouth daily.,  Disp: 30 tablet, Rfl: 2   atorvastatin (LIPITOR) 10 MG tablet, Take 1 tablet (10 mg total) by mouth daily., Disp: 30 tablet, Rfl: 2   budesonide-formoterol (SYMBICORT) 160-4.5 MCG/ACT inhaler, INHALE 2 PUFFS INTO THE LUNGS DAILY., Disp: 10.2 g, Rfl: 6   montelukast (SINGULAIR) 10 MG tablet, Take 1 tablet (10 mg total) by mouth daily., Disp: 90 tablet, Rfl: 1   omeprazole (PRILOSEC) 20 MG capsule, Take 1 capsule (20 mg total) by mouth daily., Disp: 90 capsule, Rfl: 1   No Known Allergies    The patient states she is post menopausal status.  Patient's last menstrual period was 03/04/2015.  Negative for Dysmenorrhea and Negative for Menorrhagia. Negative for: breast discharge, breast lump(s), breast pain and breast self exam. Associated symptoms include abnormal vaginal bleeding. Pertinent negatives include abnormal bleeding (hematology), anxiety, decreased libido, depression, difficulty falling sleep, dyspareunia, history of infertility, nocturia, sexual dysfunction, sleep disturbances, urinary incontinence, urinary urgency, vaginal discharge and vaginal itching. Diet regular.The patient states her exercise level is minimal.   The patient's tobacco use is:  Social History   Tobacco Use  Smoking Status Never  Smokeless Tobacco Never   She has been exposed to passive smoke. The  patient's alcohol use is:  Social History   Substance and Sexual Activity  Alcohol Use No  Additional information: Last pap 05/11/2021, next one scheduled for 05/11/2024.    Review of Systems  Constitutional: Negative.   HENT: Negative.    Eyes: Negative.   Respiratory: Negative.    Cardiovascular:  Positive for chest pain.  Gastrointestinal: Negative.   Endocrine: Negative.   Genitourinary: Negative.   Musculoskeletal: Negative.   Skin: Negative.   Allergic/Immunologic: Negative.   Neurological: Negative.   Hematological: Negative.   Psychiatric/Behavioral: Negative.       Today's Vitals   05/23/22 1021   BP: 120/60  Pulse: (!) 58  Temp: 98.5 F (36.9 C)  TempSrc: Oral  Weight: 208 lb (94.3 kg)  Height: 5' 1.2" (1.554 m)   Body mass index is 39.04 kg/m.  Wt Readings from Last 3 Encounters:  05/23/22 208 lb (94.3 kg)  05/11/21 203 lb (92.1 kg)  02/09/21 199 lb 6.4 oz (90.4 kg)    Objective:  Physical Exam Vitals reviewed.  Constitutional:      General: She is not in acute distress.    Appearance: Normal appearance. She is well-developed. She is obese.  HENT:     Head: Normocephalic and atraumatic.     Right Ear: Hearing, tympanic membrane, ear canal and external ear normal. There is no impacted cerumen.     Left Ear: Hearing, tympanic membrane, ear canal and external ear normal. There is no impacted cerumen.     Nose:     Comments: Deferred - masked    Mouth/Throat:     Comments: Deferred - masked Eyes:     General: Lids are normal.     Extraocular Movements: Extraocular movements intact.     Conjunctiva/sclera: Conjunctivae normal.     Pupils: Pupils are equal, round, and reactive to light.     Funduscopic exam:    Right eye: No papilledema.        Left eye: No papilledema.  Neck:     Thyroid: No thyroid mass.     Vascular: No carotid bruit.  Cardiovascular:     Rate and Rhythm: Normal rate and regular rhythm.     Pulses: Normal pulses.     Heart sounds: Normal heart sounds. No murmur heard. Pulmonary:     Effort: Pulmonary effort is normal. No respiratory distress.     Breath sounds: Normal breath sounds. No wheezing.  Chest:     Chest wall: No mass.  Breasts:    Tanner Score is 5.     Right: Normal. No mass or tenderness.     Left: Normal. No mass or tenderness.  Abdominal:     General: Abdomen is flat. Bowel sounds are normal. There is no distension.     Palpations: Abdomen is soft.     Tenderness: There is no abdominal tenderness.  Genitourinary:    Rectum: Guaiac result negative.  Musculoskeletal:        General: No swelling. Normal range of motion.      Cervical back: Full passive range of motion without pain, normal range of motion and neck supple.     Right lower leg: No edema.     Left lower leg: No edema.  Lymphadenopathy:     Upper Body:     Right upper body: No supraclavicular, axillary or pectoral adenopathy.     Left upper body: No supraclavicular, axillary or pectoral adenopathy.  Skin:    General: Skin is warm and  dry.     Capillary Refill: Capillary refill takes less than 2 seconds.  Neurological:     General: No focal deficit present.     Mental Status: She is alert and oriented to person, place, and time.     Cranial Nerves: No cranial nerve deficit.     Sensory: No sensory deficit.  Psychiatric:        Mood and Affect: Mood normal.        Behavior: Behavior normal.        Thought Content: Thought content normal.        Judgment: Judgment normal.         Assessment And Plan:     1. Encounter for annual physical exam Behavior modifications discussed and diet history reviewed.   Pt will continue to exercise regularly and modify diet with low GI, plant based foods and decrease intake of processed foods.  Recommend intake of daily multivitamin, Vitamin D, and calcium.  Recommend mammogram and colonoscopy for preventive screenings, as well as recommend immunizations that include influenza, TDAP, and Shingles  2. Class 2 obesity due to excess calories without serious comorbidity with body mass index (BMI) of 39.0 to 39.9 in adult She is encouraged to strive for BMI less than 30 to decrease cardiac risk. Advised to aim for at least 150 minutes of exercise per week.  3. Encounter for screening mammogram for breast cancer Pt instructed on Self Breast Exam.According to ACOG guidelines Women aged 37 and older are recommended to get an annual mammogram. Form completed and given to patient contact the [facility] for appointment scheduing.  Pt encouraged to get annual mammogram - MM Digital Screening; Future  4.  Other long term (current) drug therapy - CBC  5. History of asthma - budesonide-formoterol (SYMBICORT) 160-4.5 MCG/ACT inhaler; INHALE 2 PUFFS INTO THE LUNGS DAILY.  Dispense: 10.2 g; Refill: 6 - montelukast (SINGULAIR) 10 MG tablet; Take 1 tablet (10 mg total) by mouth daily.  Dispense: 90 tablet; Refill: 1  6. Mixed hyperlipidemia Comments: Cholesterol levels are still elevated, will recheck levels. Encouraged to eat a low fat diet - CMP14+EGFR - Lipid panel - atorvastatin (LIPITOR) 10 MG tablet; Take 1 tablet (10 mg total) by mouth daily.  Dispense: 30 tablet; Refill: 2  7. Prediabetes Comments: continue current medications, encouraged to eat a low sugar and carbohydrate diet - Hemoglobin A1c  8. Vitamin D deficiency Will check vitamin D level and supplement as needed.    Also encouraged to spend 15 minutes in the sun daily.  - VITAMIN D 25 Hydroxy (Vit-D Deficiency, Fractures)  9. Hot flashes Comments: Will try her on Paxil, discussed side effects - PARoxetine (PAXIL) 10 MG tablet; Take 1 tablet (10 mg total) by mouth daily.  Dispense: 30 tablet; Refill: 2  10. Left foot pain Comments: She wears compression socks to help with her circulation when standing or walking for long periods. - DG Ankle Complete Left; Future - Ambulatory referral to Podiatry  11. Other chest pain EKG done no significant change from 2018 Sinus bradycardia - EKG 12-Lead    Patient was given opportunity to ask questions. Patient verbalized understanding of the plan and was able to repeat key elements of the plan. All questions were answered to their satisfaction.   Minette Brine, FNP   I, Minette Brine, FNP, have reviewed all documentation for this visit. The documentation on 05/23/22 for the exam, diagnosis, procedures, and orders are all accurate and complete.  THE PATIENT IS ENCOURAGED TO PRACTICE SOCIAL DISTANCING DUE TO THE COVID-19 PANDEMIC.   

## 2022-05-24 LAB — LIPID PANEL
Chol/HDL Ratio: 3.6 ratio (ref 0.0–4.4)
Cholesterol, Total: 239 mg/dL — ABNORMAL HIGH (ref 100–199)
HDL: 66 mg/dL (ref >39)
LDL Chol Calc (NIH): 160 mg/dL — ABNORMAL HIGH (ref 0–99)
Triglycerides: 74 mg/dL (ref 0–149)
VLDL Cholesterol Cal: 13 mg/dL (ref 5–40)

## 2022-05-24 LAB — CMP14+EGFR
ALT: 38 IU/L — ABNORMAL HIGH (ref 0–32)
AST: 36 IU/L (ref 0–40)
Albumin/Globulin Ratio: 1.1 — ABNORMAL LOW (ref 1.2–2.2)
Albumin: 4.3 g/dL (ref 3.8–4.9)
Alkaline Phosphatase: 94 IU/L (ref 44–121)
BUN/Creatinine Ratio: 20 (ref 9–23)
BUN: 14 mg/dL (ref 6–24)
Bilirubin Total: 0.4 mg/dL (ref 0.0–1.2)
CO2: 23 mmol/L (ref 20–29)
Calcium: 9.9 mg/dL (ref 8.7–10.2)
Chloride: 101 mmol/L (ref 96–106)
Creatinine, Ser: 0.71 mg/dL (ref 0.57–1.00)
Globulin, Total: 4 g/dL (ref 1.5–4.5)
Glucose: 92 mg/dL (ref 70–99)
Potassium: 4.3 mmol/L (ref 3.5–5.2)
Sodium: 138 mmol/L (ref 134–144)
Total Protein: 8.3 g/dL (ref 6.0–8.5)
eGFR: 98 mL/min/{1.73_m2} (ref >59)

## 2022-05-24 LAB — HEMOGLOBIN A1C
Est. average glucose Bld gHb Est-mCnc: 137 mg/dL
Hgb A1c MFr Bld: 6.4 % — ABNORMAL HIGH (ref 4.8–5.6)

## 2022-05-24 LAB — VITAMIN D 25 HYDROXY (VIT D DEFICIENCY, FRACTURES): Vit D, 25-Hydroxy: 34.5 ng/mL (ref 30.0–100.0)

## 2022-05-30 ENCOUNTER — Other Ambulatory Visit (HOSPITAL_COMMUNITY): Payer: Self-pay

## 2022-05-30 MED ORDER — METFORMIN HCL 500 MG PO TABS
500.0000 mg | ORAL_TABLET | Freq: Two times a day (BID) | ORAL | 2 refills | Status: DC
  Filled 2022-05-30: qty 60, 30d supply, fill #0

## 2022-06-08 ENCOUNTER — Ambulatory Visit (INDEPENDENT_AMBULATORY_CARE_PROVIDER_SITE_OTHER): Payer: Commercial Managed Care - PPO

## 2022-06-08 ENCOUNTER — Ambulatory Visit (INDEPENDENT_AMBULATORY_CARE_PROVIDER_SITE_OTHER): Payer: Commercial Managed Care - PPO | Admitting: Podiatry

## 2022-06-08 DIAGNOSIS — M25571 Pain in right ankle and joints of right foot: Secondary | ICD-10-CM

## 2022-06-08 DIAGNOSIS — M722 Plantar fascial fibromatosis: Secondary | ICD-10-CM

## 2022-06-08 MED ORDER — MELOXICAM 15 MG PO TABS: 15.0000 mg | ORAL_TABLET | Freq: Every day | ORAL | 1 refills | Status: DC

## 2022-06-08 NOTE — Progress Notes (Signed)
   Chief Complaint  Patient presents with   Foot Pain    Rm 6 Left heel pain x 1 month. Dull aching pain in bottom heel. No tingling or numbness. Some edema. No injury that she can recall. Pt states she did start wearing a new pair of shoe that may have caused the pain.     HPI: 57 y.o. female presenting today as a new patient for evaluation of left posterior heel pain has been going on for few weeks now.  Patient states that she purchased a pair of sketcher shoes which irritated the posterior aspect of her heel.  She has since discontinued the shoes and overall there is been significant improvement.  She has taken Tylenol for the pain. Patient also states that she has been experiencing pain and tenderness to the right ankle.  She does have a history of ankle fracture which was treated conservatively in 2010.  She says that over the past few years she has developed some pain and tenderness.  Past Medical History:  Diagnosis Date   Cough    GERD (gastroesophageal reflux disease)    Hyperlipidemia    Murmur, heart     Past Surgical History:  Procedure Laterality Date   BREAST LUMPECTOMY  2003   CESAREAN SECTION     NASAL SINUS SURGERY      No Known Allergies   Physical Exam: General: The patient is alert and oriented x3 in no acute distress.  Dermatology: Skin is warm, dry and supple bilateral lower extremities. Negative for open lesions or macerations.  Vascular: Palpable pedal pulses bilaterally. Capillary refill within normal limits.  Negative for any significant edema or erythema  Neurological: Light touch and protective threshold grossly intact  Musculoskeletal Exam: No pedal deformities noted.  Mild tenderness with palpation throughout the posterior heel left.  There is also some tenderness with palpation lateral gutter right ankle  Radiographic Exam LT foot and RT ankle:  Normal osseous mineralization. Joint spaces preserved. No fracture/dislocation/boney destruction.   Pes planovalgus deformity with collapse of the medial longitudinal arch noted.  The tibiotalar joint is preserved right ankle  Assessment: 1.  Capsulitis right ankle lateral aspect secondary to pes planovalgus deformity 2.  Insertional Achilles tendinitis; improved   Plan of Care:  1. Patient evaluated. X-Rays reviewed.  2.  Prescription for meloxicam 15 mg daily as needed 3.  Recommend good supportive shoes that do not irritate the posterior heel 4.  Patient declined cortisone injection to the right ankle.  Recommend ankle brace as needed 5.  Return to clinic as needed      Edrick Kins, DPM [facility]  Dr. Edrick Kins, DPM    [address], [place] [address]                Office [phone]  Fax [phone]

## 2022-06-14 ENCOUNTER — Other Ambulatory Visit: Payer: Self-pay | Admitting: Podiatry

## 2022-06-14 ENCOUNTER — Other Ambulatory Visit (HOSPITAL_COMMUNITY): Payer: Self-pay

## 2022-06-14 DIAGNOSIS — M722 Plantar fascial fibromatosis: Secondary | ICD-10-CM

## 2022-06-14 DIAGNOSIS — M25571 Pain in right ankle and joints of right foot: Secondary | ICD-10-CM

## 2022-07-09 ENCOUNTER — Other Ambulatory Visit: Payer: Self-pay | Admitting: Nurse Practitioner

## 2022-07-09 ENCOUNTER — Other Ambulatory Visit (HOSPITAL_COMMUNITY): Payer: Self-pay

## 2022-07-09 DIAGNOSIS — M2550 Pain in unspecified joint: Secondary | ICD-10-CM

## 2022-07-09 DIAGNOSIS — M542 Cervicalgia: Secondary | ICD-10-CM

## 2022-07-10 ENCOUNTER — Other Ambulatory Visit (HOSPITAL_COMMUNITY): Payer: Self-pay

## 2022-07-10 MED ORDER — MELOXICAM 7.5 MG PO TABS
7.5000 mg | ORAL_TABLET | Freq: Every day | ORAL | 2 refills | Status: DC
  Filled 2022-07-10: qty 30, 30d supply, fill #0
  Filled 2022-09-20: qty 30, 30d supply, fill #1

## 2022-07-25 ENCOUNTER — Other Ambulatory Visit (HOSPITAL_COMMUNITY): Payer: Self-pay

## 2022-09-20 ENCOUNTER — Other Ambulatory Visit: Payer: Self-pay

## 2022-09-20 ENCOUNTER — Other Ambulatory Visit (HOSPITAL_COMMUNITY): Payer: Self-pay

## 2022-09-25 ENCOUNTER — Other Ambulatory Visit (HOSPITAL_COMMUNITY): Payer: Self-pay

## 2022-09-27 ENCOUNTER — Other Ambulatory Visit (HOSPITAL_COMMUNITY): Payer: Self-pay

## 2022-09-27 ENCOUNTER — Other Ambulatory Visit: Payer: Self-pay

## 2022-09-27 ENCOUNTER — Telehealth: Payer: Self-pay | Admitting: Nurse Practitioner

## 2022-09-27 DIAGNOSIS — Z8709 Personal history of other diseases of the respiratory system: Secondary | ICD-10-CM

## 2022-09-27 MED ORDER — BUDESONIDE-FORMOTEROL FUMARATE 160-4.5 MCG/ACT IN AERO: 2.0000 | INHALATION_SPRAY | Freq: Every day | RESPIRATORY_TRACT | 6 refills | Status: DC

## 2022-09-27 NOTE — Telephone Encounter (Signed)
Prescription Request  09/27/2022  LOV: 05/23/2022  What is the name of the medication or equipment? budesonide-formoterol (SYMBICORT) 160-4.5 MCG/ACT inhaler   Have you contacted your pharmacy to request a refill? Yes   Which pharmacy would you like this sent to?    [id number] - [place], [place] - [address] AT [facility] [address] [place] [address] Phone: [phone] Fax: [phone]    Patient notified that their request is being sent to the clinical staff for review and that they should receive a response within 2 business days.   Please advise at Mobile [phone] (mobile)

## 2022-09-28 ENCOUNTER — Other Ambulatory Visit (HOSPITAL_COMMUNITY): Payer: Self-pay

## 2022-09-28 ENCOUNTER — Ambulatory Visit: Payer: Commercial Managed Care - PPO | Admitting: Nurse Practitioner

## 2022-09-28 ENCOUNTER — Encounter: Payer: Self-pay | Admitting: Nurse Practitioner

## 2022-09-28 VITALS — BP 130/80 | HR 66 | Temp 98.6°F | Ht 61.0 in | Wt 217.0 lb

## 2022-09-28 DIAGNOSIS — R7303 Prediabetes: Secondary | ICD-10-CM

## 2022-09-28 DIAGNOSIS — E782 Mixed hyperlipidemia: Secondary | ICD-10-CM

## 2022-09-28 DIAGNOSIS — R232 Flushing: Secondary | ICD-10-CM | POA: Diagnosis not present

## 2022-09-28 DIAGNOSIS — Z6841 Body Mass Index (BMI) 40.0 and over, adult: Secondary | ICD-10-CM

## 2022-09-28 DIAGNOSIS — Z8709 Personal history of other diseases of the respiratory system: Secondary | ICD-10-CM

## 2022-09-28 MED ORDER — VEOZAH 45 MG PO TABS
1.0000 | ORAL_TABLET | Freq: Every day | ORAL | 5 refills | Status: DC
  Filled 2022-09-28: qty 30, 30d supply, fill #0

## 2022-09-28 MED ORDER — ATORVASTATIN CALCIUM 10 MG PO TABS
10.0000 mg | ORAL_TABLET | Freq: Every day | ORAL | 1 refills | Status: DC
Start: 2022-09-28 — End: 2023-01-11
  Filled 2022-09-28: qty 90, 90d supply, fill #0

## 2022-09-28 MED ORDER — BUDESONIDE-FORMOTEROL FUMARATE 160-4.5 MCG/ACT IN AERO: 2.0000 | INHALATION_SPRAY | Freq: Every day | RESPIRATORY_TRACT | 6 refills | Status: DC

## 2022-09-28 MED ORDER — ZEPBOUND 2.5 MG/0.5ML ~~LOC~~ SOAJ
2.5000 mg | SUBCUTANEOUS | 0 refills | Status: DC
Start: 2022-09-28 — End: 2022-11-21
  Filled 2022-09-28: qty 2, 28d supply, fill #0

## 2022-09-28 NOTE — Progress Notes (Signed)
Hershal Coria Martin,acting as a Neurosurgeon for Arnette Felts, FNP.,have documented all relevant documentation on the behalf of Arnette Felts, FNP,as directed by  Arnette Felts, FNP while in the presence of Arnette Felts, FNP.    Subjective:     Patient ID: Paula Sparks , female    DOB: 1966-01-26 , 57 y.o.   MRN: [id number]   Chief Complaint  Patient presents with   Hot Flashes    HPI  Patient presents today for hot flashes. Patient reports that she is having hot flashes and they are increasing. Patient reports she was managing the hot flashes but now they are "out of control"   she will be sitting at work and will have sweat pouring down her. She has brain fog. She is exercising by walking, when she is walking she is having shortness of breath. She has cut back on her carbs and sweets.   She went podiatry and diagnosed with plantar fascitis and went to [facility] and got some in soles.   Patient also wants weight loss medications.  BP Readings from Last 3 Encounters: 09/28/22 : 130/80 05/23/22 : 120/60 05/11/21 : 120/70  Wt Readings from Last 3 Encounters: 09/28/22 : 217 lb (98.4 kg) 05/23/22 : 208 lb (94.3 kg) 05/11/21 : 203 lb (92.1 kg)     Past Medical History:  Diagnosis Date   Cough    GERD (gastroesophageal reflux disease)    Hyperlipidemia    Murmur, heart      Family History  Problem Relation Age of Onset   Hypertension Mother    Heart failure Mother    Heart disease Neg Hx    Breast cancer Neg Hx      Current Outpatient Medications:    aspirin EC 81 MG tablet, Take 81 mg by mouth daily., Disp: , Rfl:    cetirizine (ZYRTEC) 10 MG tablet, Take 10 mg by mouth daily., Disp: , Rfl:    collagenase (SANTYL) 250 UNIT/GM ointment, Apply topically daily for 10 days, Disp: 30 g, Rfl: 0   Fezolinetant (VEOZAH) 45 MG TABS, Take 1 tablet (45 mg total) by mouth daily., Disp: 30 tablet, Rfl: 5   fish oil-omega-3 fatty acids 1000 MG capsule, Take 2 g by mouth  daily., Disp: , Rfl:    fluticasone (FLONASE) 50 MCG/ACT nasal spray, Place 2 sprays into both nostrils daily., Disp: 16 g, Rfl: 2   ibuprofen (ADVIL) 600 MG tablet, Take 1 tablet (600 mg total) by mouth every 6 (six) hours as needed., Disp: 30 tablet, Rfl: 0   meloxicam (MOBIC) 15 MG tablet, Take 1 tablet (15 mg total) by mouth daily., Disp: 30 tablet, Rfl: 1   meloxicam (MOBIC) 7.5 MG tablet, Take 1 tablet (7.5 mg total) by mouth daily., Disp: 30 tablet, Rfl: 2   montelukast (SINGULAIR) 10 MG tablet, Take 1 tablet (10 mg total) by mouth daily., Disp: 90 tablet, Rfl: 1   Multiple Vitamins-Minerals (MULTIVITAMIN WITH MINERALS) tablet, Take 1 tablet by mouth daily., Disp: , Rfl:    omeprazole (PRILOSEC) 20 MG capsule, Take 1 capsule (20 mg total) by mouth daily., Disp: 90 capsule, Rfl: 1   PARoxetine (PAXIL) 10 MG tablet, Take 1 tablet (10 mg total) by mouth daily., Disp: 30 tablet, Rfl: 2   tirzepatide (ZEPBOUND) 2.5 MG/0.5ML Pen, Inject 2.5 mg into the skin once a week., Disp: 2 mL, Rfl: 0   albuterol (VENTOLIN HFA) 108 (90 Base) MCG/ACT inhaler, INHALE 2 PUFFS INTO THE LUNGS EVERY  6 (SIX) HOURS AS NEEDED FOR WHEEZING OR SHORTNESS OF BREATH., Disp: 18 g, Rfl: 1   atorvastatin (LIPITOR) 10 MG tablet, Take 1 tablet (10 mg total) by mouth daily., Disp: 90 tablet, Rfl: 1   budesonide-formoterol (SYMBICORT) 160-4.5 MCG/ACT inhaler, INHALE 2 PUFFS INTO THE LUNGS DAILY., Disp: 10.2 g, Rfl: 6   metFORMIN (GLUCOPHAGE) 500 MG tablet, Take 1 tablet (500 mg total) by mouth 2 (two) times daily with a meal. (Patient not taking: Reported on 09/28/2022), Disp: 60 tablet, Rfl: 2   No Known Allergies   Review of Systems  Constitutional: Negative.   Eyes: Negative.   Respiratory: Negative.    Cardiovascular: Negative.  Negative for chest pain, palpitations and leg swelling.  Musculoskeletal: Negative.   Skin: Negative.   Neurological:  Negative for dizziness and headaches.  Psychiatric/Behavioral: Negative.        Today's Vitals   09/28/22 1048  BP: 130/80  Pulse: 66  Temp: 98.6 F (37 C)  TempSrc: Oral  Weight: 217 lb (98.4 kg)  Height: 5\' 1"  (1.549 m)  PainSc: 6   PainLoc: Foot   Body mass index is 41 kg/m.  Wt Readings from Last 3 Encounters:  09/28/22 217 lb (98.4 kg)  05/23/22 208 lb (94.3 kg)  05/11/21 203 lb (92.1 kg)    The 10-year ASCVD risk score (Arnett DK, et al., 2019) is: 5.2%   Values used to calculate the score:     Age: 64 years     Sex: Female     Is Non-Hispanic African American: Yes     Diabetic: No     Tobacco smoker: No     Systolic Blood Pressure: 130 mmHg     Is BP treated: No     HDL Cholesterol: 66 mg/dL     Total Cholesterol: 239 mg/dL  Objective:  Physical Exam Vitals reviewed.  Constitutional:      General: She is not in acute distress.    Appearance: Normal appearance. She is obese.  Cardiovascular:     Rate and Rhythm: Normal rate and regular rhythm.     Pulses: Normal pulses.     Heart sounds: Normal heart sounds. No murmur heard. Pulmonary:     Effort: Pulmonary effort is normal. No respiratory distress.     Breath sounds: Normal breath sounds. No wheezing.  Skin:    General: Skin is warm and dry.     Findings: No rash.  Neurological:     General: No focal deficit present.     Mental Status: She is alert and oriented to person, place, and time.     Cranial Nerves: No cranial nerve deficit.  Psychiatric:        Mood and Affect: Mood normal.        Behavior: Behavior normal.        Thought Content: Thought content normal.        Judgment: Judgment normal.         Assessment And Plan:     1. Hot flashes Comments: She has not had any relief with Paxil.  Will try her on Veozah pending insurance approval.  Savings card given.  If not approved will try her on estradiol - Fezolinetant (VEOZAH) 45 MG TABS; Take 1 tablet (45 mg total) by mouth daily.  Dispense: 30 tablet; Refill: 5  2. Mixed hyperlipidemia Comments: Cholesterol  levels are still elevated, will recheck levels. Encouraged to eat a low fat diet. Medication adherence show picked up twice in  the last 4 months - atorvastatin (LIPITOR) 10 MG tablet; Take 1 tablet (10 mg total) by mouth daily.  Dispense: 90 tablet; Refill: 1 - Lipid panel  3. Prediabetes Comments: Hemoglobin A1c was 6.4 at last visit.  She did not start the metformin. Will check today pending results may need to start Ozempic or other GLP-1 - Hemoglobin A1c  4. Class 3 severe obesity due to excess calories with body mass index (BMI) of 40.0 to 44.9 in adult, unspecified whether serious comorbidity present [facility]) Comments: Discussed options for weight loss.  Will try to start her on Zepbound. Continue with exercising at least 150 mins/wk and healthy diet. - tirzepatide (ZEPBOUND) 2.5 MG/0.5ML Pen; Inject 2.5 mg into the skin once a week.  Dispense: 2 mL; Refill: 0  5. History of asthma Comments: [facility] - budesonide-formoterol (SYMBICORT) 160-4.5 MCG/ACT inhaler; INHALE 2 PUFFS INTO THE LUNGS DAILY.  Dispense: 10.2 g; Refill: 6    Return if symptoms worsen or fail to improve.   Patient was given opportunity to ask questions. Patient verbalized understanding of the plan and was able to repeat key elements of the plan. All questions were answered to their satisfaction.   Arnette Felts, FNP   I, Arnette Felts, FNP, have reviewed all documentation for this visit. The documentation on 09/28/22 for the exam, diagnosis, procedures, and orders are all accurate and complete.   IF YOU HAVE BEEN REFERRED TO A SPECIALIST, IT MAY TAKE 1-2 WEEKS TO SCHEDULE/PROCESS THE REFERRAL. IF YOU HAVE NOT HEARD FROM US/SPECIALIST IN TWO WEEKS, PLEASE GIVE [place] A CALL AT [phone] X [phone].   THE PATIENT IS ENCOURAGED TO PRACTICE SOCIAL DISTANCING DUE TO THE COVID-19 PANDEMIC.

## 2022-09-28 NOTE — Patient Instructions (Addendum)
Managing Hot Flashes During Menopause You will learn what hot flashes/night sweats are, what causes them and what the treatment methods are. To view the content, go to this web address: [email or web address]  This video will expire on: 04/25/2024. If you need access to this video following this date, please reach out to the healthcare provider who assigned it to you. This information is not intended to replace advice given to you by your health care provider. Make sure you discuss any questions you have with your health care provider. Elsevier Patient Education  2023 [employer] Inc.  

## 2022-09-29 LAB — LIPID PANEL
Chol/HDL Ratio: 3.3 ratio (ref 0.0–4.4)
Cholesterol, Total: 187 mg/dL (ref 100–199)
HDL: 57 mg/dL (ref >39)
LDL Chol Calc (NIH): 120 mg/dL — ABNORMAL HIGH (ref 0–99)
Triglycerides: 50 mg/dL (ref 0–149)
VLDL Cholesterol Cal: 10 mg/dL (ref 5–40)

## 2022-09-29 LAB — HEMOGLOBIN A1C
Est. average glucose Bld gHb Est-mCnc: 143 mg/dL
Hgb A1c MFr Bld: 6.6 % — ABNORMAL HIGH (ref 4.8–5.6)

## 2022-10-03 ENCOUNTER — Other Ambulatory Visit (HOSPITAL_COMMUNITY): Payer: Self-pay

## 2022-10-06 ENCOUNTER — Other Ambulatory Visit (HOSPITAL_COMMUNITY): Payer: Self-pay

## 2022-10-23 ENCOUNTER — Other Ambulatory Visit (HOSPITAL_COMMUNITY): Payer: Self-pay

## 2022-10-25 ENCOUNTER — Encounter (HOSPITAL_COMMUNITY): Payer: Self-pay

## 2022-10-25 ENCOUNTER — Other Ambulatory Visit: Payer: Self-pay

## 2022-10-25 ENCOUNTER — Other Ambulatory Visit (HOSPITAL_COMMUNITY): Payer: Self-pay

## 2022-10-25 DIAGNOSIS — R232 Flushing: Secondary | ICD-10-CM

## 2022-10-25 DIAGNOSIS — Z8709 Personal history of other diseases of the respiratory system: Secondary | ICD-10-CM

## 2022-11-21 ENCOUNTER — Encounter: Payer: Self-pay | Admitting: Nurse Practitioner

## 2022-11-21 ENCOUNTER — Ambulatory Visit: Payer: Commercial Managed Care - PPO | Admitting: Nurse Practitioner

## 2022-11-21 VITALS — BP 122/80 | HR 60 | Temp 98.1°F | Ht 61.0 in | Wt 213.0 lb

## 2022-11-21 DIAGNOSIS — E1169 Type 2 diabetes mellitus with other specified complication: Secondary | ICD-10-CM

## 2022-11-21 DIAGNOSIS — R232 Flushing: Secondary | ICD-10-CM

## 2022-11-21 DIAGNOSIS — Z6841 Body Mass Index (BMI) 40.0 and over, adult: Secondary | ICD-10-CM

## 2022-11-21 DIAGNOSIS — E782 Mixed hyperlipidemia: Secondary | ICD-10-CM | POA: Diagnosis not present

## 2022-11-21 DIAGNOSIS — E669 Obesity, unspecified: Secondary | ICD-10-CM | POA: Insufficient documentation

## 2022-11-21 MED ORDER — VEOZAH 45 MG PO TABS: 1.0000 | ORAL_TABLET | Freq: Every day | ORAL | 5 refills | Status: DC

## 2022-11-21 MED ORDER — SEMAGLUTIDE(0.25 OR 0.5MG/DOS) 2 MG/3ML ~~LOC~~ SOPN
0.5000 mg | PEN_INJECTOR | SUBCUTANEOUS | 0 refills | Status: DC
Start: 2022-11-21 — End: 2023-01-09

## 2022-11-21 MED ORDER — PAROXETINE HCL 10 MG PO TABS: 10.0000 mg | ORAL_TABLET | Freq: Every day | ORAL | 1 refills | Status: DC

## 2022-11-21 NOTE — Assessment & Plan Note (Addendum)
Allyne Gee has been effective continue Paxil as we try to get your Veozah approved.  Another discount savings card was given [facility]

## 2022-11-21 NOTE — Assessment & Plan Note (Signed)
This is a new diagnosis will provide sample of Ozempic, given titration instructions to start at 0.25 mg x 4 weeks then increase to 0.5 mg.  Offer diabetic education or nutritionist at this time she has declined.  Encouraged to continue exercise regularly.

## 2022-11-21 NOTE — Progress Notes (Signed)
Madelaine Bhat, CMA,acting as a Neurosurgeon for Arnette Felts, FNP.,have documented all relevant documentation on the behalf of Arnette Felts, FNP,as directed by  Arnette Felts, FNP while in the presence of Arnette Felts, FNP.  Subjective:  Patient ID: Paula Sparks , female    DOB: 02-Jul-1965 , 57 y.o.   MRN: [id number]  Chief Complaint  Patient presents with   Hyperlipidemia    HPI  Patient presents today for a chol and pre DM follow up. Patient reports compliance with medications. Patient has no concerns today, patient denies any chest pain, SOB, or headaches. She took [place] which was effective however it was denied. She reports her mood improved and she felt "lighter and better".      Past Medical History:  Diagnosis Date   Cough    GERD (gastroesophageal reflux disease)    Hyperlipidemia    Murmur, heart      Family History  Problem Relation Age of Onset   Hypertension Mother    Heart failure Mother    Heart disease Neg Hx    Breast cancer Neg Hx      Current Outpatient Medications:    albuterol (VENTOLIN HFA) 108 (90 Base) MCG/ACT inhaler, INHALE 2 PUFFS INTO THE LUNGS EVERY 6 (SIX) HOURS AS NEEDED FOR WHEEZING OR SHORTNESS OF BREATH., Disp: 18 g, Rfl: 1   aspirin EC 81 MG tablet, Take 81 mg by mouth daily., Disp: , Rfl:    atorvastatin (LIPITOR) 10 MG tablet, Take 1 tablet (10 mg total) by mouth daily., Disp: 90 tablet, Rfl: 1   budesonide-formoterol (SYMBICORT) 160-4.5 MCG/ACT inhaler, INHALE 2 PUFFS INTO THE LUNGS DAILY., Disp: 10.2 g, Rfl: 6   cetirizine (ZYRTEC) 10 MG tablet, Take 10 mg by mouth daily., Disp: , Rfl:    fish oil-omega-3 fatty acids 1000 MG capsule, Take 2 g by mouth daily., Disp: , Rfl:    fluticasone (FLONASE) 50 MCG/ACT nasal spray, Place 2 sprays into both nostrils daily., Disp: 16 g, Rfl: 2   ibuprofen (ADVIL) 600 MG tablet, Take 1 tablet (600 mg total) by mouth every 6 (six) hours as needed., Disp: 30 tablet, Rfl: 0   meloxicam (MOBIC) 7.5  MG tablet, Take 1 tablet (7.5 mg total) by mouth daily., Disp: 30 tablet, Rfl: 2   montelukast (SINGULAIR) 10 MG tablet, Take 1 tablet (10 mg total) by mouth daily., Disp: 90 tablet, Rfl: 1   Multiple Vitamins-Minerals (MULTIVITAMIN WITH MINERALS) tablet, Take 1 tablet by mouth daily., Disp: , Rfl:    omeprazole (PRILOSEC) 20 MG capsule, Take 1 capsule (20 mg total) by mouth daily., Disp: 90 capsule, Rfl: 1   Semaglutide,0.25 or 0.5MG /DOS, 2 MG/3ML SOPN, Inject 0.5 mg into the skin once a week., Disp: 3 mL, Rfl: 0   Fezolinetant (VEOZAH) 45 MG TABS, Take 1 tablet (45 mg total) by mouth daily., Disp: 30 tablet, Rfl: 5   PARoxetine (PAXIL) 10 MG tablet, Take 1 tablet (10 mg total) by mouth daily., Disp: 90 tablet, Rfl: 1   No Known Allergies   Review of Systems  Constitutional: Negative.   Eyes: Negative.   Respiratory: Negative.    Cardiovascular: Negative.  Negative for chest pain, palpitations and leg swelling.  Musculoskeletal: Negative.   Skin: Negative.   Neurological:  Negative for dizziness and headaches.  Psychiatric/Behavioral: Negative.       Today's Vitals   11/21/22 0921  BP: 122/80  Pulse: 60  Temp: 98.1 F (36.7 C)  Weight: 213 lb (96.6  kg)  Height: 5\' 1"  (1.549 m)  PainSc: 0-No pain   Body mass index is 40.25 kg/m.  Wt Readings from Last 3 Encounters:  11/21/22 213 lb (96.6 kg)  09/28/22 217 lb (98.4 kg)  05/23/22 208 lb (94.3 kg)     Objective:  Physical Exam Vitals reviewed.  Constitutional:      General: She is not in acute distress.    Appearance: Normal appearance. She is obese.  Cardiovascular:     Rate and Rhythm: Normal rate and regular rhythm.     Pulses: Normal pulses.     Heart sounds: Normal heart sounds. No murmur heard. Pulmonary:     Effort: Pulmonary effort is normal. No respiratory distress.     Breath sounds: Normal breath sounds. No wheezing.  Skin:    General: Skin is warm and dry.     Findings: No rash.  Neurological:      General: No focal deficit present.     Mental Status: She is alert and oriented to person, place, and time.     Cranial Nerves: No cranial nerve deficit.  Psychiatric:        Mood and Affect: Mood normal.        Behavior: Behavior normal.        Thought Content: Thought content normal.        Judgment: Judgment normal.         Assessment And Plan:  Mixed hyperlipidemia Assessment & Plan: Cholesterol levels have been stable.  Will continue statin based on new diagnosis of type 2 diabetes for cardioprotection   Type 2 diabetes mellitus with obesity (HCC) Assessment & Plan: This is a new diagnosis will provide sample of Ozempic, given titration instructions to start at 0.25 mg x 4 weeks then increase to 0.5 mg.  Offer diabetic education or nutritionist at this time she has declined.  Encouraged to continue exercise regularly.  Orders: -     Semaglutide(0.25 or 0.5MG /DOS); Inject 0.5 mg into the skin once a week.  Dispense: 3 mL; Refill: 0 -     Microalbumin / creatinine urine ratio  Hot flashes Assessment & Plan: Allyne Gee has been effective continue Paxil as we try to get your Veozah approved.  Another discount savings card was given to take to [facility]  Orders: -     [place]; Take 1 tablet (45 mg total) by mouth daily.  Dispense: 30 tablet; Refill: 5 -     PARoxetine HCl; Take 1 tablet (10 mg total) by mouth daily.  Dispense: 90 tablet; Refill: 1  Class 3 severe obesity due to excess calories with body mass index (BMI) of 40.0 to 44.9 in adult, unspecified whether serious comorbidity present [facility]) Assessment & Plan: She is encouraged to strive for BMI less than 30 to decrease cardiac risk. Advised to aim for at least 150 minutes of exercise per week.      Return for keep next appt as scheduled .  Patient was given opportunity to ask questions. Patient verbalized understanding of the plan and was able to repeat key elements of the plan. All questions were answered to their  satisfaction.    Jeanell Sparrow, FNP, have reviewed all documentation for this visit. The documentation on 11/21/22 for the exam, diagnosis, procedures, and orders are all accurate and complete.   IF YOU HAVE BEEN REFERRED TO A SPECIALIST, IT MAY TAKE 1-2 WEEKS TO SCHEDULE/PROCESS THE REFERRAL. IF YOU HAVE NOT HEARD FROM US/SPECIALIST IN TWO WEEKS, PLEASE GIVE  [place] A CALL AT [phone].

## 2022-11-21 NOTE — Assessment & Plan Note (Signed)
She is encouraged to strive for BMI less than 30 to decrease cardiac risk. Advised to aim for at least 150 minutes of exercise per week.  

## 2022-11-21 NOTE — Assessment & Plan Note (Signed)
Cholesterol levels have been stable.  Will continue statin based on new diagnosis of type 2 diabetes for cardioprotection

## 2022-12-24 LAB — HM MAMMOGRAPHY

## 2023-01-09 ENCOUNTER — Other Ambulatory Visit: Payer: Self-pay | Admitting: Nurse Practitioner

## 2023-01-09 ENCOUNTER — Other Ambulatory Visit: Payer: Self-pay | Admitting: Podiatry

## 2023-01-09 DIAGNOSIS — E782 Mixed hyperlipidemia: Secondary | ICD-10-CM

## 2023-01-09 DIAGNOSIS — E669 Obesity, unspecified: Secondary | ICD-10-CM

## 2023-01-11 ENCOUNTER — Other Ambulatory Visit (HOSPITAL_COMMUNITY): Payer: Self-pay

## 2023-01-11 ENCOUNTER — Other Ambulatory Visit: Payer: Self-pay

## 2023-01-11 DIAGNOSIS — E782 Mixed hyperlipidemia: Secondary | ICD-10-CM

## 2023-01-11 MED ORDER — ATORVASTATIN CALCIUM 10 MG PO TABS
10.0000 mg | ORAL_TABLET | Freq: Every day | ORAL | 2 refills | Status: DC
Start: 2023-01-11 — End: 2023-01-16
  Filled 2023-01-11: qty 30, 30d supply, fill #0

## 2023-01-15 ENCOUNTER — Other Ambulatory Visit: Payer: Self-pay | Admitting: Nurse Practitioner

## 2023-01-15 DIAGNOSIS — E782 Mixed hyperlipidemia: Secondary | ICD-10-CM

## 2023-01-16 ENCOUNTER — Other Ambulatory Visit (HOSPITAL_COMMUNITY): Payer: Self-pay

## 2023-01-18 ENCOUNTER — Encounter: Payer: Self-pay | Admitting: Nurse Practitioner

## 2023-01-22 ENCOUNTER — Other Ambulatory Visit: Payer: Self-pay

## 2023-01-22 ENCOUNTER — Other Ambulatory Visit (HOSPITAL_COMMUNITY): Payer: Self-pay

## 2023-01-22 DIAGNOSIS — E1169 Type 2 diabetes mellitus with other specified complication: Secondary | ICD-10-CM

## 2023-01-22 MED ORDER — OZEMPIC (0.25 OR 0.5 MG/DOSE) 2 MG/3ML ~~LOC~~ SOPN
0.5000 mg | PEN_INJECTOR | SUBCUTANEOUS | 1 refills | Status: DC
Start: 2023-01-22 — End: 2023-04-01
  Filled 2023-01-22: qty 3, 28d supply, fill #0

## 2023-02-01 ENCOUNTER — Other Ambulatory Visit (HOSPITAL_COMMUNITY): Payer: Self-pay

## 2023-03-30 ENCOUNTER — Other Ambulatory Visit: Payer: Self-pay | Admitting: Nurse Practitioner

## 2023-03-30 DIAGNOSIS — E1169 Type 2 diabetes mellitus with other specified complication: Secondary | ICD-10-CM

## 2023-04-01 ENCOUNTER — Other Ambulatory Visit: Payer: Self-pay | Admitting: Nurse Practitioner

## 2023-04-01 DIAGNOSIS — E669 Obesity, unspecified: Secondary | ICD-10-CM

## 2023-04-14 ENCOUNTER — Other Ambulatory Visit: Payer: Self-pay

## 2023-04-14 ENCOUNTER — Encounter (HOSPITAL_COMMUNITY): Payer: Self-pay | Admitting: Emergency Medicine

## 2023-04-14 ENCOUNTER — Ambulatory Visit (HOSPITAL_COMMUNITY)
Admission: EM | Admit: 2023-04-14 | Discharge: 2023-04-14 | Disposition: A | Discharge status: Home / Self Care | Payer: Commercial Managed Care - PPO | Attending: Emergency Medicine | Admitting: Emergency Medicine

## 2023-04-14 DIAGNOSIS — J4521 Mild intermittent asthma with (acute) exacerbation: Secondary | ICD-10-CM

## 2023-04-14 DIAGNOSIS — J069 Acute upper respiratory infection, unspecified: Secondary | ICD-10-CM | POA: Diagnosis not present

## 2023-04-14 MED ORDER — AZITHROMYCIN 250 MG PO TABS
250.0000 mg | ORAL_TABLET | Freq: Every day | ORAL | 0 refills | Status: DC
Start: 2023-04-14 — End: 2023-05-28

## 2023-04-14 MED ORDER — BENZONATATE 200 MG PO CAPS: 200.0000 mg | ORAL_CAPSULE | Freq: Three times a day (TID) | ORAL | 0 refills | Status: DC

## 2023-04-14 MED ORDER — PREDNISONE 20 MG PO TABS: 40.0000 mg | ORAL_TABLET | Freq: Every day | ORAL | 0 refills | Status: AC

## 2023-04-14 NOTE — Discharge Instructions (Addendum)
Take all antibiotics as prescribed and until finished, you can take them with food to prevent gastrointestinal upset.  Take the steroids today and then for the next 4 days with breakfast.  Continue to use your albuterol inhaler as needed.  You can take 1200 mg of Mucinex to loosen secretions in addition to sleeping with a humidifier.   Your symptoms should improve over the next few days on antibiotics and steroids.  If no improvement or any changes return to clinic or follow-up with your primary care provider.

## 2023-04-14 NOTE — ED Triage Notes (Addendum)
Reports a family member has been sick.  Reports spouse -[facility], and a blood [facility] (at [facility]).    Patient has a cough that started Wednesday.  Symptoms are getting worse.  Has used theraflu, cough drops.  Cough is worsening, and has laryngitis.  Requesting a work note as well  Reports cough phlegm is yellow sometimes

## 2023-04-14 NOTE — ED Provider Notes (Signed)
[facility]    CSN: [id number] Arrival date & time: 04/14/23  1220      History   Chief Complaint Chief Complaint  Patient presents with   Cough    HPI Paula Sparks is a 57 y.o. female.   Patient presents to clinic for complaints of a ongoing and worsening cough.  Cough started on Wednesday.  It has been productive and she is congested.  She has been checking her temperatures and she has not had any fevers.  Her husband has been sick recently, he is currently [facility] with a blood clot in his heart, per patient.  Her son was also sick recently but he has improved.  She has been trying multiple over-the-counter home remedies like TheraFlu and cough drops.  She does have a history of asthma and has been using her albuterol inhaler as well as her Symbicort.  She will have shortness of breath with coughing fits.  The history is provided by the patient and medical records.  Cough   Past Medical History:  Diagnosis Date   Cough    GERD (gastroesophageal reflux disease)    Hyperlipidemia    Murmur, heart     Patient Active Problem List   Diagnosis Date Noted   Mixed hyperlipidemia 11/21/2022   Type 2 diabetes mellitus with obesity (HCC) 11/21/2022   Class 3 severe obesity due to excess calories with body mass index (BMI) of 40.0 to 44.9 in adult (HCC) 11/21/2022   Hot flashes 11/21/2022   Morbid obesity (HCC) 02/25/2019   Tibialis posterior tendinitis 03/14/2015   Pes planus of both feet 03/14/2015   Pronation deformity of both feet 03/14/2015   Cardiac murmur 05/11/2011   Cough 04/03/2011    Past Surgical History:  Procedure Laterality Date   BREAST LUMPECTOMY  2003   CESAREAN SECTION     NASAL SINUS SURGERY      OB History   No obstetric history on file.      Home Medications    Prior to Admission medications   Medication Sig Start Date End Date Taking? Authorizing Provider  azithromycin (ZITHROMAX) 250 MG tablet Take 1  tablet (250 mg total) by mouth daily. Take first 2 tablets together, then 1 every day until finished. 04/14/23  Yes Rinaldo Ratel, [place] N, FNP  predniSONE (DELTASONE) 20 MG tablet Take 2 tablets (40 mg total) by mouth daily for 5 days. 04/14/23 04/19/23 Yes Rinaldo Ratel, [place] N, FNP  albuterol (VENTOLIN HFA) 108 (90 Base) MCG/ACT inhaler INHALE 2 PUFFS INTO THE LUNGS EVERY 6 (SIX) HOURS AS NEEDED FOR WHEEZING OR SHORTNESS OF BREATH. 06/26/21 11/21/22  Arnette Felts, FNP  aspirin EC 81 MG tablet Take 81 mg by mouth daily.    [provider]  atorvastatin (LIPITOR) 10 MG tablet TAKE 1 TABLET BY MOUTH EVERY DAY 01/16/23   Arnette Felts, FNP  budesonide-formoterol (SYMBICORT) 160-4.5 MCG/ACT inhaler INHALE 2 PUFFS INTO THE LUNGS DAILY. 09/28/22   Arnette Felts, FNP  cetirizine (ZYRTEC) 10 MG tablet Take 10 mg by mouth daily.    [provider]  Fezolinetant (VEOZAH) 45 MG TABS Take 1 tablet (45 mg total) by mouth daily. Patient not taking: Reported on 04/14/2023 11/21/22   Arnette Felts, FNP  fish oil-omega-3 fatty acids 1000 MG capsule Take 2 g by mouth daily.    [provider]  fluticasone (FLONASE) 50 MCG/ACT nasal spray Place 2 sprays into both nostrils daily. 02/09/21 12/19/30  Arnette Felts, FNP  ibuprofen (ADVIL)  600 MG tablet Take 1 tablet (600 mg total) by mouth every 6 (six) hours as needed. 03/06/19   Mickie Bail, NP  meloxicam (MOBIC) 15 MG tablet TAKE 1 TABLET (15 MG TOTAL) BY MOUTH DAILY. 01/09/23   Felecia Shelling, DPM  meloxicam (MOBIC) 7.5 MG tablet Take 1 tablet (7.5 mg total) by mouth daily. 07/10/22   Arnette Felts, FNP  montelukast (SINGULAIR) 10 MG tablet Take 1 tablet (10 mg total) by mouth daily. 05/23/22   Arnette Felts, FNP  Multiple Vitamins-Minerals (MULTIVITAMIN WITH MINERALS) tablet Take 1 tablet by mouth daily.    [provider]  omeprazole (PRILOSEC) 20 MG capsule Take 1 capsule (20 mg total) by mouth daily. 05/23/22   Arnette Felts, FNP  PARoxetine  (PAXIL) 10 MG tablet Take 1 tablet (10 mg total) by mouth daily. 11/21/22   Arnette Felts, FNP  Semaglutide,0.25 or 0.5MG /DOS, (OZEMPIC, 0.25 OR 0.5 MG/DOSE,) 2 MG/3ML SOPN INJECT 0.5 MG INTO THE SKIN ONE TIME PER WEEK Patient not taking: Reported on 04/14/2023 04/01/23   Arnette Felts, FNP    Family History Family History  Problem Relation Age of Onset   Hypertension Mother    Heart failure Mother    Heart disease Neg Hx    Breast cancer Neg Hx     Social History Social History   Tobacco Use   Smoking status: Never   Smokeless tobacco: Never  Vaping Use   Vaping status: Never Used  Substance Use Topics   Alcohol use: No   Drug use: No     Allergies   Patient has no known allergies.   Review of Systems Review of Systems  Per HPI   Physical Exam Triage Vital Signs ED Triage Vitals  Encounter Vitals Group     BP 04/14/23 1325 139/79     Systolic BP Percentile --      Diastolic BP Percentile --      Pulse Rate 04/14/23 1325 67     Resp 04/14/23 1325 20     Temp 04/14/23 1325 98.3 F (36.8 C)     Temp Source 04/14/23 1325 Oral     SpO2 04/14/23 1325 96 %     Weight --      Height --      Head Circumference --      Peak Flow --      Pain Score 04/14/23 1322 0     Pain Loc --      Pain Education --      Exclude from Growth Chart --    No data found.  Updated Vital Signs BP 139/79 (BP Location: Right Arm) Comment (BP Location): large cuff  Pulse 67   Temp 98.3 F (36.8 C) (Oral)   Resp 20   LMP 03/04/2015   SpO2 96%   Visual Acuity Right Eye Distance:   Left Eye Distance:   Bilateral Distance:    Right Eye Near:   Left Eye Near:    Bilateral Near:     Physical Exam Vitals and nursing note reviewed.  Constitutional:      Appearance: Normal appearance.  HENT:     Head: Normocephalic and atraumatic.     Right Ear: External ear normal.     Left Ear: External ear normal.     Nose: Congestion present.     Mouth/Throat:     Mouth: Mucous  membranes are moist.  Eyes:     Conjunctiva/sclera: Conjunctivae normal.  Cardiovascular:  Rate and Rhythm: Normal rate and regular rhythm.     Heart sounds: Normal heart sounds. No murmur heard. Pulmonary:     Effort: Pulmonary effort is normal. No respiratory distress.     Breath sounds: Normal breath sounds.  Musculoskeletal:        General: Normal range of motion.  Skin:    General: Skin is warm and dry.  Neurological:     General: No focal deficit present.     Mental Status: She is alert and oriented to person, place, and time.  Psychiatric:        Mood and Affect: Mood normal.        Behavior: Behavior normal.      UC Treatments / Results  Labs (all labs ordered are listed, but only abnormal results are displayed) Labs Reviewed - No data to display  EKG   Radiology No results found.  Procedures Procedures (including critical care time)  Medications Ordered in UC Medications - No data to display  Initial Impression / Assessment and Plan / UC Course  I have reviewed the triage vital signs and the nursing notes.  Pertinent labs & imaging results that were available during my care of the patient were reviewed by me and considered in my medical decision making (see chart for details).  Vitals and triage reviewed, patient is hemodynamically stable.  Lungs are vesicular, heart with regular rate and rhythm.  Patient with history of asthma and increase in rescue inhaler use, will cover for mild asthma exacerbation with a steroid burst.  Will cover with azithromycin for upper respiratory infection versus atypical due to high community rates of atypical pneumonia.  Work note provided.  Symptomatic management of cough discussed.  Plan of care, follow-up care and return precautions given, no questions at this time.     Final Clinical Impressions(s) / UC Diagnoses   Final diagnoses:  Acute upper respiratory infection  Mild intermittent asthma with acute exacerbation      Discharge Instructions      Take all antibiotics as prescribed and until finished, you can take them with food to prevent gastrointestinal upset.  Take the steroids today and then for the next 4 days with breakfast.  Continue to use your albuterol inhaler as needed.  You can take 1200 mg of Mucinex to loosen secretions in addition to sleeping with a humidifier.   Your symptoms should improve over the next few days on antibiotics and steroids.  If no improvement or any changes return to clinic or follow-up with your primary care provider.      ED Prescriptions     Medication Sig Dispense Auth. Provider   azithromycin (ZITHROMAX) 250 MG tablet Take 1 tablet (250 mg total) by mouth daily. Take first 2 tablets together, then 1 every day until finished. 6 tablet Rinaldo Ratel, [place] N, [place]   predniSONE (DELTASONE) 20 MG tablet Take 2 tablets (40 mg total) by mouth daily for 5 days. 10 tablet Khilee Hendricksen, [place] N, FNP      PDMP not reviewed this encounter.   Luca Dyar, [place] N, [place] 04/14/23 1345

## 2023-04-15 ENCOUNTER — Encounter: Payer: Self-pay | Admitting: Podiatry

## 2023-04-15 ENCOUNTER — Ambulatory Visit (INDEPENDENT_AMBULATORY_CARE_PROVIDER_SITE_OTHER): Payer: Commercial Managed Care - PPO | Admitting: Podiatry

## 2023-04-15 ENCOUNTER — Ambulatory Visit (INDEPENDENT_AMBULATORY_CARE_PROVIDER_SITE_OTHER): Payer: Commercial Managed Care - PPO

## 2023-04-15 VITALS — Ht 61.0 in | Wt 213.0 lb

## 2023-04-15 DIAGNOSIS — M79672 Pain in left foot: Secondary | ICD-10-CM

## 2023-04-15 DIAGNOSIS — T148XXA Other injury of unspecified body region, initial encounter: Secondary | ICD-10-CM

## 2023-04-15 DIAGNOSIS — M76822 Posterior tibial tendinitis, left leg: Secondary | ICD-10-CM | POA: Diagnosis not present

## 2023-04-15 MED ORDER — TRIAMCINOLONE ACETONIDE 10 MG/ML IJ SUSP
10.0000 mg | Freq: Once | INTRAMUSCULAR | Status: AC
  Administered 2023-04-15: 10 mg via INTRA_ARTICULAR

## 2023-04-15 NOTE — Progress Notes (Signed)
Subjective:   Patient ID: Paula Sparks, female   DOB: 57 y.o.   MRN: [id number]   HPI Patient presents stating that she has pain in her left ankle and she was treated for this around 10 months ago.  States that it has remained a problem for her and gradually has gotten worse recently and the outside hurts also now   ROS      Objective:  Physical Exam  Neurovascular status intact with inflammation pain of the posterior tibial tendon as it comes under the medial malleolus left with slight fluid accumulation no indication of tear of the tendon when tested it is functioning.  Moderate lateral pain which is probably compensatory     Assessment:  Posterior tibial tendinitis left moderate depression of the arch not severe with slight peroneal tendinitis left     Plan:  H&P reviewed and at this point I have recommended complete immobilization but I did go ahead today and I did a careful sheath injection explaining risk 3 mg dexamethasone Kenalog 5 mg Xylocaine applied air fracture walker to immobilize the ankle and instructed that we will get MRI if symptoms persist but I want to hopefully see if immobilization will take care of this for  X-rays do indicate moderate depression of the arch not severe no indications of aggressive arthritis

## 2023-05-20 ENCOUNTER — Ambulatory Visit (INDEPENDENT_AMBULATORY_CARE_PROVIDER_SITE_OTHER): Payer: Commercial Managed Care - PPO | Admitting: Podiatry

## 2023-05-20 ENCOUNTER — Other Ambulatory Visit (HOSPITAL_COMMUNITY): Payer: Self-pay

## 2023-05-20 ENCOUNTER — Encounter: Payer: Self-pay | Admitting: Podiatry

## 2023-05-20 DIAGNOSIS — M7672 Peroneal tendinitis, left leg: Secondary | ICD-10-CM

## 2023-05-20 DIAGNOSIS — M76822 Posterior tibial tendinitis, left leg: Secondary | ICD-10-CM

## 2023-05-20 DIAGNOSIS — M6702 Short Achilles tendon (acquired), left ankle: Secondary | ICD-10-CM | POA: Diagnosis not present

## 2023-05-20 MED ORDER — DICLOFENAC SODIUM 75 MG PO TBEC
75.0000 mg | DELAYED_RELEASE_TABLET | Freq: Two times a day (BID) | ORAL | 2 refills | Status: DC
  Filled 2023-05-20: qty 50, 25d supply, fill #0

## 2023-05-21 NOTE — Progress Notes (Signed)
 Subjective:   Patient ID: Paula Sparks, female   DOB: 58 y.o.   MRN: [id number]   HPI Patient states I am improved still having moderate discomfort but more in the back of the heel and the outside with the inside seeming to be moderately better   ROS      Objective:  Physical Exam  Neurovascular status intact with collapse medial longitudinal arch left of the moderate nature but it appears to be functioning posterior tib with moderate Achilles tendinitis lateral foot pain     Assessment:  Posterior tibial tendinitis improving with Achilles tendinitis peroneal tendinitis which is most likely compensatory     Plan:  H&P reviewed at great length and I went ahead today and advised on anti-inflammatories writing a prescription for diclofenac  support therapy and not going barefoot.  Reappoint as symptoms indicate may require MRI or other treatment depending on response

## 2023-05-27 NOTE — Progress Notes (Signed)
 LILLETTE Kristeen JINNY Gladis, CMA,acting as a neurosurgeon for Paula Ada, FNP.,have documented all relevant documentation on the behalf of Paula Ada, FNP,as directed by  Paula Ada, FNP while in the presence of Paula Ada, FNP.  Subjective:    Patient ID: Paula Sparks , female    DOB: Nov 24, 1965 , 58 y.o.   MRN: [id number]  Chief Complaint  Patient presents with   Annual Exam    HPI  Patient presents today for HM, Patient reports compliance with medication. Patient denies any chest pain, SOB, or headaches. Patient has no concerns today. She feels like she is on her feet all the time. She will sometimes wear her support socks. Her husband has been in the hospital and working all the time, rarely able to get her feet up.      Past Medical History:  Diagnosis Date   Cough    GERD (gastroesophageal reflux disease)    Hyperlipidemia    Murmur, heart      Family History  Problem Relation Age of Onset   Hypertension Mother    Heart failure Mother    Heart disease Neg Hx    Breast cancer Neg Hx      Current Outpatient Medications:    aspirin EC 81 MG tablet, Take 81 mg by mouth daily., Disp: , Rfl:    benzonatate  (TESSALON ) 200 MG capsule, Take 1 capsule (200 mg total) by mouth every 8 (eight) hours., Disp: 30 capsule, Rfl: 0   cetirizine (ZYRTEC) 10 MG tablet, Take 10 mg by mouth daily., Disp: , Rfl:    diclofenac  (VOLTAREN ) 75 MG EC tablet, Take 1 tablet (75 mg total) by mouth 2 (two) times daily., Disp: 50 tablet, Rfl: 2   fish oil-omega-3 fatty acids 1000 MG capsule, Take 2 g by mouth daily., Disp: , Rfl:    fluticasone  (FLONASE ) 50 MCG/ACT nasal spray, Place 2 sprays into both nostrils daily., Disp: 16 g, Rfl: 2   ibuprofen  (ADVIL ) 600 MG tablet, Take 1 tablet (600 mg total) by mouth every 6 (six) hours as needed., Disp: 30 tablet, Rfl: 0   meloxicam  (MOBIC ) 15 MG tablet, TAKE 1 TABLET (15 MG TOTAL) BY MOUTH DAILY., Disp: 30 tablet, Rfl: 2   Multiple Vitamins-Minerals  (MULTIVITAMIN WITH MINERALS) tablet, Take 1 tablet by mouth daily., Disp: , Rfl:    atorvastatin  (LIPITOR) 10 MG tablet, Take 1 tablet (10 mg total) by mouth daily., Disp: 30 tablet, Rfl: 2   budesonide -formoterol  (SYMBICORT ) 160-4.5 MCG/ACT inhaler, INHALE 2 PUFFS INTO THE LUNGS DAILY., Disp: 10.2 g, Rfl: 6   Fezolinetant  (VEOZAH ) 45 MG TABS, Take 1 tablet (45 mg total) by mouth daily., Disp: 30 tablet, Rfl: 5   montelukast  (SINGULAIR ) 10 MG tablet, Take 1 tablet (10 mg total) by mouth daily., Disp: 90 tablet, Rfl: 1   omeprazole  (PRILOSEC ) 20 MG capsule, Take 1 capsule (20 mg total) by mouth daily., Disp: 90 capsule, Rfl: 1   PARoxetine  (PAXIL ) 10 MG tablet, Take 1 tablet (10 mg total) by mouth daily., Disp: 90 tablet, Rfl: 1   Semaglutide ,0.25 or 0.5MG /DOS, (OZEMPIC , 0.25 OR 0.5 MG/DOSE,) 2 MG/3ML SOPN, Inject 0.5 mg into the skin once a week., Disp: 9 mL, Rfl: 1   No Known Allergies    The patient states she uses none for birth control. Patient's last menstrual period was 03/04/2015.. Negative for Dysmenorrhea and Negative for Menorrhagia. Negative for: breast discharge, breast lump(s), breast pain and breast self exam. Associated symptoms include abnormal vaginal bleeding. Pertinent  negatives include abnormal bleeding (hematology), anxiety, decreased libido, depression, difficulty falling sleep, dyspareunia, history of infertility, nocturia, sexual dysfunction, sleep disturbances, urinary incontinence, urinary urgency, vaginal discharge and vaginal itching. Diet regular.  The patient states her exercise level is minimal.   .   The patient's tobacco use is:  Social History   Tobacco Use  Smoking Status Never  Smokeless Tobacco Never   She has been exposed to passive smoke. The patient's alcohol use is:  Social History   Substance and Sexual Activity  Alcohol Use No   Additional information: Last pap 04/2021, next one scheduled for 04/2026.    Review of Systems  Constitutional:  Negative.   HENT: Negative.    Eyes: Negative.   Respiratory: Negative.    Cardiovascular:  Negative for chest pain, palpitations and leg swelling.  Gastrointestinal: Negative.   Endocrine: Negative.   Genitourinary: Negative.   Musculoskeletal: Negative.   Skin: Negative.   Allergic/Immunologic: Negative.   Neurological: Negative.  Negative for headaches.  Hematological: Negative.   Psychiatric/Behavioral: Negative.       Today's Vitals   05/28/23 1017  BP: 120/80  Temp: 98.6 F (37 C)  TempSrc: Oral  Weight: 224 lb 6.4 oz (101.8 kg)  PainSc: 4   PainLoc: Foot   Body mass index is 42.4 kg/m.  Wt Readings from Last 3 Encounters:  05/28/23 224 lb 6.4 oz (101.8 kg)  04/15/23 213 lb (96.6 kg)  11/21/22 213 lb (96.6 kg)       05/28/2023   10:18 AM 09/28/2022   10:44 AM 05/23/2022   10:20 AM 11/07/2020    4:14 PM 05/28/2019    4:39 PM  Depression screen PHQ 2/9  Decreased Interest 0 0 0 0 0  Down, Depressed, Hopeless 0 0 0 0 0  PHQ - 2 Score 0 0 0 0 0  Altered sleeping 0 0     Tired, decreased energy 0 0     Change in appetite 0 0     Feeling bad or failure about yourself  0 0     Trouble concentrating 0 0     Moving slowly or fidgety/restless 0 0     Suicidal thoughts 0 0     PHQ-9 Score 0 0     Difficult doing work/chores Not difficult at all Not difficult at all       Diabetic foot exam was performed with the following findings:   She has a boot to her left foot    In system  Objective:  Physical Exam Vitals reviewed.  Constitutional:      General: She is not in acute distress.    Appearance: Normal appearance. She is well-developed. She is obese.  HENT:     Head: Normocephalic and atraumatic.     Right Ear: Hearing, tympanic membrane, ear canal and external ear normal. There is no impacted cerumen.     Left Ear: Hearing, tympanic membrane, ear canal and external ear normal. There is no impacted cerumen.     Nose:     Comments: Deferred - masked     Mouth/Throat:     Comments: Deferred - masked Eyes:     General: Lids are normal.     Extraocular Movements: Extraocular movements intact.     Conjunctiva/sclera: Conjunctivae normal.     Pupils: Pupils are equal, round, and reactive to light.     Funduscopic exam:    Right eye: No papilledema.  Left eye: No papilledema.  Neck:     Thyroid : No thyroid  mass.     Vascular: No carotid bruit.  Cardiovascular:     Rate and Rhythm: Normal rate and regular rhythm.     Pulses: Normal pulses.     Heart sounds: Normal heart sounds. No murmur heard. Pulmonary:     Effort: Pulmonary effort is normal. No respiratory distress.     Breath sounds: Normal breath sounds. No wheezing.  Chest:     Chest wall: No mass.  Breasts:    Tanner Score is 5.     Right: Normal. No mass or tenderness.     Left: Normal. No mass or tenderness.  Abdominal:     General: Abdomen is flat. Bowel sounds are normal. There is no distension.     Palpations: Abdomen is soft.     Tenderness: There is no abdominal tenderness.  Genitourinary:    Rectum: Guaiac result negative.  Musculoskeletal:        General: No swelling. Normal range of motion.     Cervical back: Full passive range of motion without pain, normal range of motion and neck supple.     Right lower leg: No edema.     Left lower leg: No edema.  Lymphadenopathy:     Upper Body:     Right upper body: No supraclavicular, axillary or pectoral adenopathy.     Left upper body: No supraclavicular, axillary or pectoral adenopathy.  Skin:    General: Skin is warm and dry.     Capillary Refill: Capillary refill takes less than 2 seconds.  Neurological:     General: No focal deficit present.     Mental Status: She is alert and oriented to person, place, and time.     Cranial Nerves: No cranial nerve deficit.     Sensory: No sensory deficit.  Psychiatric:        Mood and Affect: Mood normal.        Behavior: Behavior normal.        Thought Content:  Thought content normal.        Judgment: Judgment normal.       Assessment And Plan:     Encounter for annual health examination Assessment & Plan: Behavior modifications discussed and diet history reviewed.   Pt will continue to exercise regularly and modify diet with low GI, plant based foods and decrease intake of processed foods.  Recommend intake of daily multivitamin, Vitamin D , and calcium .  Recommend mammogram and colonoscopy for preventive screenings, as well as recommend immunizations that include influenza, TDAP, and Shingles    Mixed hyperlipidemia Assessment & Plan: Cholesterol levels have been stable.  Will continue statin based on new diagnosis of type 2 diabetes for cardioprotection  Orders: -     CMP14+EGFR -     Lipid panel -     Atorvastatin  Calcium ; Take 1 tablet (10 mg total) by mouth daily.  Dispense: 30 tablet; Refill: 2  Type 2 diabetes mellitus with obesity (HCC) Assessment & Plan: She has not been taking the Ozempic  since her last visit will resend and do PA if necessary for insurance to cover. EKG done with no changes from previous, HR 58  Orders: -     EKG 12-Lead -     POCT URINALYSIS DIP (CLINITEK) -     Microalbumin / creatinine urine ratio -     Hemoglobin A1c -     Ozempic  (0.25 or 0.5 MG/DOSE); Inject 0.5  mg into the skin once a week.  Dispense: 9 mL; Refill: 1  Class 3 severe obesity due to excess calories with body mass index (BMI) of 40.0 to 44.9 in adult, unspecified whether serious comorbidity present [facility]) Assessment & Plan: She is encouraged to strive for BMI less than 30 to decrease cardiac risk. Advised to aim for at least 150 minutes of exercise per week.    COVID-19 vaccination declined Assessment & Plan: Declines covid 19 vaccine. Discussed risk of covid 56 and if she changes her mind about the vaccine to call the office. Education has been provided regarding the importance of this vaccine but patient still declined. Advised  may receive this vaccine at local pharmacy or Health Dept.or vaccine clinic. Aware to provide a copy of the vaccination record if obtained from local pharmacy or Health Dept.  Encouraged to take multivitamin, vitamin d , vitamin c and zinc to increase immune system. Aware can call office if would like to have vaccine here at office. Verbalized acceptance and understanding.    Other long term (current) drug therapy -     CBC with Differential/Platelet  Lower extremity edema Assessment & Plan: She has trace edema bilateral lower extremities, advised to cut back on breads and sweets. Advised to wear support socks during the day or when at work. Will check BNP as well to ensure not related to heart  Orders: -     Brain natriuretic peptide  Mixed hyperlipidemia Assessment & Plan: Cholesterol levels have been stable.  Will continue statin based on new diagnosis of type 2 diabetes for cardioprotection  Orders: -     CMP14+EGFR -     Lipid panel -     Atorvastatin  Calcium ; Take 1 tablet (10 mg total) by mouth daily.  Dispense: 30 tablet; Refill: 2  History of asthma -     Budesonide -Formoterol  Fumarate; INHALE 2 PUFFS INTO THE LUNGS DAILY.  Dispense: 10.2 g; Refill: 6 -     Montelukast  Sodium; Take 1 tablet (10 mg total) by mouth daily.  Dispense: 90 tablet; Refill: 1  Hot flashes Assessment & Plan: Will send Rx for veozah , paxil  has been ineffective.   Orders: -     Veozah ; Take 1 tablet (45 mg total) by mouth daily.  Dispense: 30 tablet; Refill: 5 -     PARoxetine  HCl; Take 1 tablet (10 mg total) by mouth daily.  Dispense: 90 tablet; Refill: 1  Encounter for immunization Assessment & Plan: Prevnar 20 given in office  Orders: -     Pneumococcal conjugate vaccine 20-valent  Other orders -     Omeprazole ; Take 1 capsule (20 mg total) by mouth daily.  Dispense: 90 capsule; Refill: 1     Return for 1 year physical, controlled DM check 4 months. Patient was given opportunity to ask  questions. Patient verbalized understanding of the plan and was able to repeat key elements of the plan. All questions were answered to their satisfaction.   Paula Ada, FNP  I, Paula Ada, FNP, have reviewed all documentation for this visit. The documentation on 05/28/23 for the exam, diagnosis, procedures, and orders are all accurate and complete.

## 2023-05-28 ENCOUNTER — Encounter: Payer: Self-pay | Admitting: Nurse Practitioner

## 2023-05-28 ENCOUNTER — Ambulatory Visit (INDEPENDENT_AMBULATORY_CARE_PROVIDER_SITE_OTHER): Payer: Commercial Managed Care - PPO | Admitting: Nurse Practitioner

## 2023-05-28 VITALS — BP 120/80 | Temp 98.6°F | Wt 224.4 lb

## 2023-05-28 DIAGNOSIS — Z2821 Immunization not carried out because of patient refusal: Secondary | ICD-10-CM

## 2023-05-28 DIAGNOSIS — Z6841 Body Mass Index (BMI) 40.0 and over, adult: Secondary | ICD-10-CM

## 2023-05-28 DIAGNOSIS — Z23 Encounter for immunization: Secondary | ICD-10-CM | POA: Diagnosis not present

## 2023-05-28 DIAGNOSIS — Z8709 Personal history of other diseases of the respiratory system: Secondary | ICD-10-CM

## 2023-05-28 DIAGNOSIS — E66813 Obesity, class 3: Secondary | ICD-10-CM

## 2023-05-28 DIAGNOSIS — Z0000 Encounter for general adult medical examination without abnormal findings: Secondary | ICD-10-CM

## 2023-05-28 DIAGNOSIS — E782 Mixed hyperlipidemia: Secondary | ICD-10-CM

## 2023-05-28 DIAGNOSIS — R600 Localized edema: Secondary | ICD-10-CM

## 2023-05-28 DIAGNOSIS — E1169 Type 2 diabetes mellitus with other specified complication: Secondary | ICD-10-CM | POA: Diagnosis not present

## 2023-05-28 DIAGNOSIS — R232 Flushing: Secondary | ICD-10-CM

## 2023-05-28 DIAGNOSIS — Z79899 Other long term (current) drug therapy: Secondary | ICD-10-CM

## 2023-05-28 DIAGNOSIS — R7303 Prediabetes: Secondary | ICD-10-CM

## 2023-05-28 LAB — POCT URINALYSIS DIP (CLINITEK)
Bilirubin, UA: NEGATIVE
Blood, UA: NEGATIVE
Glucose, UA: NEGATIVE mg/dL
Ketones, POC UA: NEGATIVE mg/dL
Nitrite, UA: NEGATIVE
POC PROTEIN,UA: NEGATIVE
Spec Grav, UA: 1.025 (ref 1.010–1.025)
Urobilinogen, UA: 0.2 U/dL
pH, UA: 7 (ref 5.0–8.0)

## 2023-05-28 MED ORDER — OZEMPIC (0.25 OR 0.5 MG/DOSE) 2 MG/3ML ~~LOC~~ SOPN: 0.5000 mg | PEN_INJECTOR | SUBCUTANEOUS | 1 refills | Status: DC

## 2023-05-28 MED ORDER — ATORVASTATIN CALCIUM 10 MG PO TABS: 10.0000 mg | ORAL_TABLET | Freq: Every day | ORAL | 2 refills | Status: DC

## 2023-05-28 MED ORDER — MONTELUKAST SODIUM 10 MG PO TABS: 10.0000 mg | ORAL_TABLET | Freq: Every day | ORAL | 1 refills | Status: DC

## 2023-05-28 MED ORDER — PAROXETINE HCL 10 MG PO TABS: 10.0000 mg | ORAL_TABLET | Freq: Every day | ORAL | 1 refills | Status: DC

## 2023-05-28 MED ORDER — OMEPRAZOLE 20 MG PO CPDR: 20.0000 mg | DELAYED_RELEASE_CAPSULE | Freq: Every day | ORAL | 1 refills | Status: DC

## 2023-05-28 MED ORDER — VEOZAH 45 MG PO TABS: 1.0000 | ORAL_TABLET | Freq: Every day | ORAL | 5 refills | Status: DC

## 2023-05-28 MED ORDER — BUDESONIDE-FORMOTEROL FUMARATE 160-4.5 MCG/ACT IN AERO: 2.0000 | INHALATION_SPRAY | Freq: Every day | RESPIRATORY_TRACT | 6 refills | Status: DC

## 2023-05-28 NOTE — Patient Instructions (Signed)
 Health Maintenance  Topic Date Due   Eye exam for diabetics  Never done   Yearly kidney health urinalysis for diabetes  Never done   Pneumococcal Vaccination (2 of 2 - PCV) 02/19/2020   Hemoglobin A1C  03/31/2023   Yearly kidney function blood test for diabetes  05/24/2023   COVID-19 Vaccine (6 - 2024-25 season) 06/13/2023*   Flu Shot  08/12/2023*   Zoster (Shingles) Vaccine (2 of 2) 08/26/2023*   Mammogram  12/24/2023   Complete foot exam   05/27/2024   Colon Cancer Screening  06/28/2024   Pap with HPV screening  05/11/2026   DTaP/Tdap/Td vaccine (2 - Td or Tdap) 05/12/2031   Hepatitis C Screening  Completed   HIV Screening  Completed   HPV Vaccine  Aged Out  *Topic was postponed. The date shown is not the original due date.   I have given you a sample of Ozempic  start at 0.25 mg weekly x 4 weeks then 0.5 mg weekly.  Be sure to wear support socks when you are at work.

## 2023-05-29 LAB — CBC WITH DIFFERENTIAL/PLATELET
Basophils Absolute: 0.1 10*3/uL (ref 0.0–0.2)
Basos: 1 %
EOS (ABSOLUTE): 0.1 10*3/uL (ref 0.0–0.4)
Eos: 2 %
Hematocrit: 40.2 % (ref 34.0–46.6)
Hemoglobin: 12.4 g/dL (ref 11.1–15.9)
Immature Grans (Abs): 0 10*3/uL (ref 0.0–0.1)
Immature Granulocytes: 0 %
Lymphocytes Absolute: 2.8 10*3/uL (ref 0.7–3.1)
Lymphs: 45 %
MCH: 26.6 pg (ref 26.6–33.0)
MCHC: 30.8 g/dL — ABNORMAL LOW (ref 31.5–35.7)
MCV: 86 fL (ref 79–97)
Monocytes Absolute: 0.5 10*3/uL (ref 0.1–0.9)
Monocytes: 8 %
Neutrophils Absolute: 2.8 10*3/uL (ref 1.4–7.0)
Neutrophils: 44 %
Platelets: 334 10*3/uL (ref 150–450)
RBC: 4.67 x10E6/uL (ref 3.77–5.28)
RDW: 14.1 % (ref 11.7–15.4)
WBC: 6.4 10*3/uL (ref 3.4–10.8)

## 2023-05-29 LAB — HEMOGLOBIN A1C
Est. average glucose Bld gHb Est-mCnc: 143 mg/dL
Hgb A1c MFr Bld: 6.6 % — ABNORMAL HIGH (ref 4.8–5.6)

## 2023-05-29 LAB — BRAIN NATRIURETIC PEPTIDE: BNP: 5.1 pg/mL (ref 0.0–100.0)

## 2023-05-29 LAB — CMP14+EGFR
ALT: 20 [IU]/L (ref 0–32)
AST: 20 [IU]/L (ref 0–40)
Albumin: 4 g/dL (ref 3.8–4.9)
Alkaline Phosphatase: 94 [IU]/L (ref 44–121)
BUN/Creatinine Ratio: 20 (ref 9–23)
BUN: 14 mg/dL (ref 6–24)
Bilirubin Total: 0.3 mg/dL (ref 0.0–1.2)
CO2: 22 mmol/L (ref 20–29)
Calcium: 9.5 mg/dL (ref 8.7–10.2)
Chloride: 104 mmol/L (ref 96–106)
Creatinine, Ser: 0.69 mg/dL (ref 0.57–1.00)
Globulin, Total: 3.5 g/dL (ref 1.5–4.5)
Glucose: 96 mg/dL (ref 70–99)
Potassium: 4.8 mmol/L (ref 3.5–5.2)
Sodium: 141 mmol/L (ref 134–144)
Total Protein: 7.5 g/dL (ref 6.0–8.5)
eGFR: 100 mL/min/{1.73_m2} (ref >59)

## 2023-05-29 LAB — LIPID PANEL
Chol/HDL Ratio: 3.9 {ratio} (ref 0.0–4.4)
Cholesterol, Total: 248 mg/dL — ABNORMAL HIGH (ref 100–199)
HDL: 64 mg/dL (ref >39)
LDL Chol Calc (NIH): 169 mg/dL — ABNORMAL HIGH (ref 0–99)
Triglycerides: 88 mg/dL (ref 0–149)
VLDL Cholesterol Cal: 15 mg/dL (ref 5–40)

## 2023-05-29 LAB — MICROALBUMIN / CREATININE URINE RATIO
Creatinine, Urine: 86.1 mg/dL
Microalb/Creat Ratio: <3 mg/g{creat} (ref 0–29)
Microalbumin, Urine: <3 ug/mL

## 2023-05-30 ENCOUNTER — Other Ambulatory Visit (HOSPITAL_COMMUNITY): Payer: Self-pay

## 2023-06-10 ENCOUNTER — Encounter: Payer: Self-pay | Admitting: Nurse Practitioner

## 2023-06-10 DIAGNOSIS — Z2821 Immunization not carried out because of patient refusal: Secondary | ICD-10-CM | POA: Insufficient documentation

## 2023-06-10 DIAGNOSIS — Z0000 Encounter for general adult medical examination without abnormal findings: Secondary | ICD-10-CM | POA: Insufficient documentation

## 2023-06-10 DIAGNOSIS — R600 Localized edema: Secondary | ICD-10-CM | POA: Insufficient documentation

## 2023-06-10 DIAGNOSIS — Z23 Encounter for immunization: Secondary | ICD-10-CM | POA: Insufficient documentation

## 2023-06-10 NOTE — Assessment & Plan Note (Signed)
Prevnar 20 given in office

## 2023-06-10 NOTE — Assessment & Plan Note (Signed)
Will send Rx for veozah, paxil has been ineffective.

## 2023-06-10 NOTE — Assessment & Plan Note (Signed)
Cholesterol levels have been stable.  Will continue statin based on new diagnosis of type 2 diabetes for cardioprotection

## 2023-06-10 NOTE — Assessment & Plan Note (Addendum)
She has trace edema bilateral lower extremities, advised to cut back on breads and sweets. Advised to wear support socks during the day or when at work. Will check BNP as well to ensure not related to heart

## 2023-06-10 NOTE — Assessment & Plan Note (Signed)
She is encouraged to strive for BMI less than 30 to decrease cardiac risk. Advised to aim for at least 150 minutes of exercise per week.

## 2023-06-10 NOTE — Assessment & Plan Note (Signed)

## 2023-06-10 NOTE — Assessment & Plan Note (Signed)

## 2023-06-10 NOTE — Assessment & Plan Note (Signed)
She has not been taking the Ozempic since her last visit will resend and do PA if necessary for insurance to cover. EKG done with no changes from previous, HR 58

## 2023-06-12 ENCOUNTER — Other Ambulatory Visit: Payer: Self-pay | Admitting: Podiatry

## 2023-06-17 ENCOUNTER — Ambulatory Visit (INDEPENDENT_AMBULATORY_CARE_PROVIDER_SITE_OTHER): Payer: Commercial Managed Care - PPO | Admitting: Podiatry

## 2023-06-17 DIAGNOSIS — Z91199 Patient's noncompliance with other medical treatment and regimen due to unspecified reason: Secondary | ICD-10-CM

## 2023-06-17 NOTE — Progress Notes (Signed)
Patient had to leave and will reschedule.

## 2023-06-28 ENCOUNTER — Encounter (HOSPITAL_COMMUNITY): Payer: Self-pay

## 2023-06-28 ENCOUNTER — Ambulatory Visit (HOSPITAL_COMMUNITY)
Admission: EM | Admit: 2023-06-28 | Discharge: 2023-06-28 | Disposition: A | Discharge status: Home / Self Care | Payer: No Typology Code available for payment source | Attending: Emergency Medicine | Admitting: Emergency Medicine

## 2023-06-28 DIAGNOSIS — J069 Acute upper respiratory infection, unspecified: Secondary | ICD-10-CM

## 2023-06-28 DIAGNOSIS — J4520 Mild intermittent asthma, uncomplicated: Secondary | ICD-10-CM | POA: Diagnosis not present

## 2023-06-28 DIAGNOSIS — R051 Acute cough: Secondary | ICD-10-CM

## 2023-06-28 MED ORDER — AZITHROMYCIN 250 MG PO TABS: ORAL_TABLET | ORAL | 0 refills | Status: DC

## 2023-06-28 MED ORDER — PREDNISONE 20 MG PO TABS: 40.0000 mg | ORAL_TABLET | Freq: Every day | ORAL | 0 refills | Status: AC

## 2023-06-28 MED ORDER — PROMETHAZINE-DM 6.25-15 MG/5ML PO SYRP
5.0000 mL | ORAL_SOLUTION | Freq: Every evening | ORAL | 0 refills | Status: DC | PRN
Start: 2023-06-28 — End: 2023-09-04

## 2023-06-28 MED ORDER — BENZONATATE 100 MG PO CAPS: 100.0000 mg | ORAL_CAPSULE | Freq: Three times a day (TID) | ORAL | 0 refills | Status: DC

## 2023-06-28 NOTE — ED Provider Notes (Signed)
[facility]    CSN: [id number] Arrival date & time: 06/28/23  4401      History   Chief Complaint Chief Complaint  Patient presents with   Cough    HPI Paula Sparks is a 58 y.o. female.   Patient presented with productive cough and congestion that began on Monday.  Patient reports noticing intermittent wheezing as well.  History of asthma.  Patient reports she ran out of her albuterol inhaler but has a refill at the pharmacy that she was going to pick up today.  Denies shortness of breath, chest pain, and fever.   Cough   Past Medical History:  Diagnosis Date   Cough    GERD (gastroesophageal reflux disease)    Hyperlipidemia    Murmur, heart     Patient Active Problem List   Diagnosis Date Noted   Encounter for annual health examination 06/10/2023   COVID-19 vaccination declined 06/10/2023   Lower extremity edema 06/10/2023   Encounter for immunization 06/10/2023   Mixed hyperlipidemia 11/21/2022   Type 2 diabetes mellitus with obesity (HCC) 11/21/2022   Class 3 severe obesity due to excess calories with body mass index (BMI) of 40.0 to 44.9 in adult (HCC) 11/21/2022   Hot flashes 11/21/2022   Morbid obesity (HCC) 02/25/2019   Tibialis posterior tendinitis 03/14/2015   Pes planus of both feet 03/14/2015   Pronation deformity of both feet 03/14/2015   Cardiac murmur 05/11/2011   Cough 04/03/2011    Past Surgical History:  Procedure Laterality Date   BREAST LUMPECTOMY  2003   CESAREAN SECTION     NASAL SINUS SURGERY      OB History   No obstetric history on file.      Home Medications    Prior to Admission medications   Medication Sig Start Date End Date Taking? Authorizing Provider  aspirin EC 81 MG tablet Take 81 mg by mouth daily.   Yes [provider]  atorvastatin (LIPITOR) 10 MG tablet Take 1 tablet (10 mg total) by mouth daily. 05/28/23  Yes Arnette Felts, FNP  azithromycin (ZITHROMAX Z-PAK) 250 MG tablet Take  2 pills (500mg ) first day and one pill (250mg ) the remaining 4 days. 06/28/23  Yes Susann Givens, Kailen Name A, NP  benzonatate (TESSALON) 100 MG capsule Take 1 capsule (100 mg total) by mouth every 8 (eight) hours. 06/28/23  Yes Susann Givens, Rhylie Stehr A, NP  budesonide-formoterol (SYMBICORT) 160-4.5 MCG/ACT inhaler INHALE 2 PUFFS INTO THE LUNGS DAILY. 05/28/23  Yes Arnette Felts, FNP  cetirizine (ZYRTEC) 10 MG tablet Take 10 mg by mouth daily.   Yes [provider]  diclofenac (VOLTAREN) 75 MG EC tablet Take 1 tablet (75 mg total) by mouth 2 (two) times daily. 05/20/23  Yes Regal, Kirstie Peri, DPM  fish oil-omega-3 fatty acids 1000 MG capsule Take 2 g by mouth daily.   Yes [provider]  fluticasone (FLONASE) 50 MCG/ACT nasal spray Place 2 sprays into both nostrils daily. 02/09/21 12/19/30 Yes Arnette Felts, FNP  ibuprofen (ADVIL) 600 MG tablet Take 1 tablet (600 mg total) by mouth every 6 (six) hours as needed. 03/06/19  Yes Mickie Bail, NP  meloxicam (MOBIC) 15 MG tablet TAKE 1 TABLET (15 MG TOTAL) BY MOUTH DAILY. 06/12/23  Yes Felecia Shelling, DPM  montelukast (SINGULAIR) 10 MG tablet Take 1 tablet (10 mg total) by mouth daily. 05/28/23  Yes Arnette Felts, FNP  Multiple Vitamins-Minerals (MULTIVITAMIN WITH MINERALS) tablet Take 1 tablet by mouth daily.  Yes [provider]  omeprazole (PRILOSEC) 20 MG capsule Take 1 capsule (20 mg total) by mouth daily. 05/28/23  Yes Arnette Felts, FNP  PARoxetine (PAXIL) 10 MG tablet Take 1 tablet (10 mg total) by mouth daily. 05/28/23  Yes Arnette Felts, FNP  predniSONE (DELTASONE) 20 MG tablet Take 2 tablets (40 mg total) by mouth daily for 5 days. 06/28/23 07/03/23 Yes Wynonia Lawman A, NP  promethazine-dextromethorphan (PROMETHAZINE-DM) 6.25-15 MG/5ML syrup Take 5 mLs by mouth at bedtime as needed for cough. 06/28/23  Yes Susann Givens, Sampson Self A, NP  Semaglutide,0.25 or 0.5MG /DOS, (OZEMPIC, 0.25 OR 0.5 MG/DOSE,) 2 MG/3ML SOPN Inject 0.5 mg into the skin once a  week. 05/28/23  Yes Arnette Felts, FNP    Family History Family History  Problem Relation Age of Onset   Hypertension Mother    Heart failure Mother    Heart disease Neg Hx    Breast cancer Neg Hx     Social History Social History   Tobacco Use   Smoking status: Never   Smokeless tobacco: Never  Vaping Use   Vaping status: Never Used  Substance Use Topics   Alcohol use: No   Drug use: No     Allergies   Patient has no known allergies.   Review of Systems Review of Systems  Respiratory:  Positive for cough.    Per HPI  Physical Exam Triage Vital Signs ED Triage Vitals  Encounter Vitals Group     BP 06/28/23 1030 128/72     Systolic BP Percentile --      Diastolic BP Percentile --      Pulse Rate 06/28/23 1030 63     Resp 06/28/23 1030 18     Temp 06/28/23 1030 98.5 F (36.9 C)     Temp Source 06/28/23 1030 Oral     SpO2 06/28/23 1030 98 %     Weight 06/28/23 1030 224 lb 6.9 oz (101.8 kg)     Height 06/28/23 1030 5\' 1"  (1.549 m)     Head Circumference --      Peak Flow --      Pain Score 06/28/23 1027 0     Pain Loc --      Pain Education --      Exclude from Growth Chart --    No data found.  Updated Vital Signs BP 128/72 (BP Location: Right Arm)   Pulse 63   Temp 98.5 F (36.9 C) (Oral)   Resp 18   Ht 5\' 1"  (1.549 m)   Wt 224 lb 6.9 oz (101.8 kg)   LMP 03/04/2015   SpO2 98%   BMI 42.41 kg/m   Visual Acuity Right Eye Distance:   Left Eye Distance:   Bilateral Distance:    Right Eye Near:   Left Eye Near:    Bilateral Near:     Physical Exam Vitals and nursing note reviewed.  Constitutional:      General: She is awake. She is not in acute distress.    Appearance: Normal appearance. She is well-developed and well-groomed. She is not ill-appearing.  HENT:     Right Ear: Tympanic membrane, ear canal and external ear normal.     Left Ear: Tympanic membrane, ear canal and external ear normal.     Nose: Congestion and rhinorrhea  present.     Mouth/Throat:     Mouth: Mucous membranes are moist.     Pharynx: Posterior oropharyngeal erythema present. No oropharyngeal exudate.  Cardiovascular:  Rate and Rhythm: Normal rate and regular rhythm.  Pulmonary:     Effort: Pulmonary effort is normal.     Breath sounds: Normal breath sounds.  Musculoskeletal:        General: Normal range of motion.     Cervical back: Normal range of motion and neck supple.  Skin:    General: Skin is warm and dry.  Neurological:     Mental Status: She is alert.  Psychiatric:        Behavior: Behavior is cooperative.      UC Treatments / Results  Labs (all labs ordered are listed, but only abnormal results are displayed) Labs Reviewed - No data to display  EKG   Radiology No results found.  Procedures Procedures (including critical care time)  Medications Ordered in UC Medications - No data to display  Initial Impression / Assessment and Plan / UC Course  I have reviewed the triage vital signs and the nursing notes.  Pertinent labs & imaging results that were available during my care of the patient were reviewed by me and considered in my medical decision making (see chart for details).     Patient presented with 5-day history of productive cough and congestion.  Patient also endorses intermittent wheezing.  History of asthma.  Patient reports she is picking up her albuterol inhaler refilled today.  Upon assessment congestion and rhinorrhea are present, mild erythema noted to pharynx.  Lungs clear bilaterally on auscultation.  No other significant findings upon exam.  Prescribed azithromycin and prednisone for persistent upper respiratory infection.  Prescribed Tessalon and Promethazine DM as needed for cough.  Discussed when to use albuterol inhaler.  Discussed return precautions. Final Clinical Impressions(s) / UC Diagnoses   Final diagnoses:  Acute upper respiratory infection  Acute cough  Mild intermittent  asthma, unspecified whether complicated     Discharge Instructions      Start taking azithromycin by taking 2 tablets today and 1 tablet on the remaining 4 days.  Take prednisone once daily for 5 days. You can take Tessalon every 8 hours as needed for cough. I have also prescribed promethazine DM cough syrup that you can take at night for cough.  This can make you drowsy so do not take while driving.  [place] your albuterol inhaler as needed for mild shortness of breath, chest tightness, and wheezing. Otherwise alternate between Tylenol and ibuprofen as needed for pain and fever.  I also recommend taking Mucinex to help with cough and congestion.  Return here if symptoms persist or worsen.     ED Prescriptions     Medication Sig Dispense Auth. Provider   benzonatate (TESSALON) 100 MG capsule Take 1 capsule (100 mg total) by mouth every 8 (eight) hours. 21 capsule Wynonia Lawman A, NP   promethazine-dextromethorphan (PROMETHAZINE-DM) 6.25-15 MG/5ML syrup Take 5 mLs by mouth at bedtime as needed for cough. 118 mL Susann Givens, Tyvon Eggenberger A, NP   azithromycin (ZITHROMAX Z-PAK) 250 MG tablet Take 2 pills (500mg ) first day and one pill (250mg ) the remaining 4 days. 6 tablet Wynonia Lawman A, NP   predniSONE (DELTASONE) 20 MG tablet Take 2 tablets (40 mg total) by mouth daily for 5 days. 10 tablet Wynonia Lawman A, NP      PDMP not reviewed this encounter.   Wynonia Lawman A, NP 06/28/23 1115

## 2023-06-28 NOTE — ED Triage Notes (Signed)
Chief Complaint: Cough that is productive now but started dry. Patient has a history of asthma.   Sick exposure: Yes- Her husband had the flu and pneumonia   Onset: 5 days  Prescriptions or OTC medications tried: Yes- ginger, le,on, and lime    with moderate relief  New foods, medications, or products: No  Recent Travel: No

## 2023-06-28 NOTE — Discharge Instructions (Addendum)
Start taking azithromycin by taking 2 tablets today and 1 tablet on the remaining 4 days.  Take prednisone once daily for 5 days. You can take Tessalon every 8 hours as needed for cough. I have also prescribed promethazine DM cough syrup that you can take at night for cough.  This can make you drowsy so do not take while driving.  [place] your albuterol inhaler as needed for mild shortness of breath, chest tightness, and wheezing. Otherwise alternate between Tylenol and ibuprofen as needed for pain and fever.  I also recommend taking Mucinex to help with cough and congestion.  Return here if symptoms persist or worsen.

## 2023-07-11 ENCOUNTER — Other Ambulatory Visit: Payer: Self-pay | Admitting: Nurse Practitioner

## 2023-07-11 DIAGNOSIS — E1169 Type 2 diabetes mellitus with other specified complication: Secondary | ICD-10-CM

## 2023-07-11 MED ORDER — OZEMPIC (0.25 OR 0.5 MG/DOSE) 2 MG/3ML ~~LOC~~ SOPN: 0.5000 mg | PEN_INJECTOR | SUBCUTANEOUS | 2 refills | Status: DC

## 2023-07-11 NOTE — Telephone Encounter (Signed)
 Copied from CRM [id number]. Topic: Clinical - Medication Refill >> Jul 11, 2023 11:05 AM Gery Pray wrote: Most Recent Primary Care Visit:  Provider: Arnette Felts  Department: [facility]  Visit Type: PHYSICAL  Date: 05/28/2023  Medication: Semaglutide,0.25 or 0.5MG /DOS, (OZEMPIC, 0.25 OR 0.5 MG/DOSE,) 2 MG/3ML SOPN  Has the patient contacted their pharmacy? Yes (Agent: If no, request that the patient contact the pharmacy for the refill. If patient does not wish to contact the pharmacy document the reason why and proceed with request.) (Agent: If yes, when and what did the pharmacy advise?)  Is this the correct pharmacy for this prescription? Yes If no, delete pharmacy and type the correct one.  This is the patient's preferred pharmacy:  [facility] [phone] [facility] [place] - [address] [place] [address] Phone: [phone] Fax: [phone]   Has the prescription been filled recently? No  Is the patient out of the medication? Yes  Has the patient been seen for an appointment in the last year OR does the patient have an upcoming appointment? Yes  Can we respond through MyChart? No Patient would like a call regarding medication refill  Agent: Please be advised that Rx refills may take up to 3 business days. We ask that you follow-up with your pharmacy.

## 2023-07-16 ENCOUNTER — Telehealth: Payer: Self-pay

## 2023-07-16 NOTE — Telephone Encounter (Signed)
 PA FOR OZEMPIC APPROVED 07/15/2023-07/15/2026. SPOKE WITH PATIENTS HUSBAND.

## 2023-09-04 ENCOUNTER — Ambulatory Visit (HOSPITAL_COMMUNITY)
Admission: EM | Admit: 2023-09-04 | Discharge: 2023-09-04 | Disposition: A | Discharge status: Home / Self Care | Attending: Family Medicine | Admitting: Family Medicine

## 2023-09-04 ENCOUNTER — Encounter (HOSPITAL_COMMUNITY): Payer: Self-pay

## 2023-09-04 DIAGNOSIS — L509 Urticaria, unspecified: Secondary | ICD-10-CM | POA: Diagnosis not present

## 2023-09-04 MED ORDER — METHYLPREDNISOLONE SODIUM SUCC 125 MG IJ SOLR
INTRAMUSCULAR | Status: AC
  Filled 2023-09-04: qty 2

## 2023-09-04 MED ORDER — DIPHENHYDRAMINE HCL 25 MG PO CAPS
25.0000 mg | ORAL_CAPSULE | Freq: Once | ORAL | Status: AC
  Administered 2023-09-04: 25 mg via ORAL

## 2023-09-04 MED ORDER — METHYLPREDNISOLONE ACETATE 80 MG/ML IJ SUSP
INTRAMUSCULAR | Status: AC
  Filled 2023-09-04: qty 1

## 2023-09-04 MED ORDER — DIPHENHYDRAMINE HCL 25 MG PO CAPS
ORAL_CAPSULE | ORAL | Status: AC
Start: 2023-09-04 — End: ?
  Filled 2023-09-04: qty 1

## 2023-09-04 MED ORDER — ALBUTEROL SULFATE (2.5 MG/3ML) 0.083% IN NEBU
INHALATION_SOLUTION | RESPIRATORY_TRACT | Status: AC
Start: 2023-09-04 — End: ?
  Filled 2023-09-04: qty 3

## 2023-09-04 MED ORDER — METHYLPREDNISOLONE SODIUM SUCC 125 MG IJ SOLR
80.0000 mg | Freq: Once | INTRAMUSCULAR | Status: AC
  Administered 2023-09-04: 80 mg via INTRAMUSCULAR

## 2023-09-04 MED ORDER — METHYLPREDNISOLONE 4 MG PO TBPK: ORAL_TABLET | ORAL | 0 refills | Status: DC

## 2023-09-04 NOTE — Discharge Instructions (Signed)
 You were seen today for rash/hives.  We have given you a shot of a steroid while here today.  I have sent out a steroid pack.  Please don't start this until tomorrow.  You  may use over the counter zyrtec or benadryl  as well for the itch, as well as over the counter topical steroids as needed.  Follow up if not improving.

## 2023-09-04 NOTE — ED Triage Notes (Signed)
 Patient presents to the office for hives all over her body x 3 days.  Home Intervention: Singular and Zyrtec

## 2023-09-04 NOTE — ED Provider Notes (Addendum)
 [facility]    CSN: [id number] Arrival date & time: 09/04/23  1102      History   Chief Complaint Chief Complaint  Patient presents with   Urticaria    HPI Paula Sparks is a 58 y.o. female.    Urticaria  Patient is here for rash/hives x several days.  This started after being outside in the yard.  This has worsened.  Was on her face at first, and now on her arms, back, torso.  Very itchy, unable to sleep.  She has not tried anything at home.  No changes in soaps, detergents/lotions.        Past Medical History:  Diagnosis Date   Cough    GERD (gastroesophageal reflux disease)    Hyperlipidemia    Murmur, heart     Patient Active Problem List   Diagnosis Date Noted   Encounter for annual health examination 06/10/2023   COVID-19 vaccination declined 06/10/2023   Lower extremity edema 06/10/2023   Encounter for immunization 06/10/2023   Mixed hyperlipidemia 11/21/2022   Type 2 diabetes mellitus with obesity (HCC) 11/21/2022   Class 3 severe obesity due to excess calories with body mass index (BMI) of 40.0 to 44.9 in adult (HCC) 11/21/2022   Hot flashes 11/21/2022   Morbid obesity (HCC) 02/25/2019   Tibialis posterior tendinitis 03/14/2015   Pes planus of both feet 03/14/2015   Pronation deformity of both feet 03/14/2015   Cardiac murmur 05/11/2011   Cough 04/03/2011    Past Surgical History:  Procedure Laterality Date   BREAST LUMPECTOMY  2003   CESAREAN SECTION     NASAL SINUS SURGERY      OB History   No obstetric history on file.      Home Medications    Prior to Admission medications   Medication Sig Start Date End Date Taking? Authorizing Provider  aspirin EC 81 MG tablet Take 81 mg by mouth daily.   Yes [provider]  atorvastatin  (LIPITOR) 10 MG tablet Take 1 tablet (10 mg total) by mouth daily. 05/28/23  Yes [name], FNP  budesonide -formoterol  (SYMBICORT ) 160-4.5 MCG/ACT inhaler INHALE 2 PUFFS INTO  THE LUNGS DAILY. 05/28/23  Yes [name], FNP  cetirizine (ZYRTEC) 10 MG tablet Take 10 mg by mouth daily.   Yes [provider]  diclofenac  (VOLTAREN ) 75 MG EC tablet Take 1 tablet (75 mg total) by mouth 2 (two) times daily. 05/20/23  Yes Regal, Angus Kenning, DPM  fish oil-omega-3 fatty acids 1000 MG capsule Take 2 g by mouth daily.   Yes [provider]  ibuprofen  (ADVIL ) 600 MG tablet Take 1 tablet (600 mg total) by mouth every 6 (six) hours as needed. 03/06/19  Yes Wellington Half, NP  meloxicam  (MOBIC ) 15 MG tablet TAKE 1 TABLET (15 MG TOTAL) BY MOUTH DAILY. 06/12/23  Yes Dot Gazella, DPM  montelukast  (SINGULAIR ) 10 MG tablet Take 1 tablet (10 mg total) by mouth daily. 05/28/23  Yes [name], FNP  Multiple Vitamins-Minerals (MULTIVITAMIN WITH MINERALS) tablet Take 1 tablet by mouth daily.   Yes [provider]  omeprazole  (PRILOSEC ) 20 MG capsule Take 1 capsule (20 mg total) by mouth daily. 05/28/23  Yes [name], FNP  PARoxetine  (PAXIL ) 10 MG tablet Take 1 tablet (10 mg total) by mouth daily. 05/28/23  Yes [name], FNP  Semaglutide ,0.25 or 0.5MG /DOS, (OZEMPIC , 0.25 OR 0.5 MG/DOSE,) 2 MG/3ML SOPN INJECT 0.5 MG INTO THE SKIN ONE TIME PER WEEK 07/11/23  Yes [name], FNP  azithromycin  (ZITHROMAX  Z-PAK) 250 MG tablet Take 2 pills (500mg ) first day and one pill (250mg ) the remaining 4 days. 06/28/23   Levora Reas A, NP  benzonatate  (TESSALON ) 100 MG capsule Take 1 capsule (100 mg total) by mouth every 8 (eight) hours. 06/28/23   Levora Reas A, NP  fluticasone  (FLONASE ) 50 MCG/ACT nasal spray Place 2 sprays into both nostrils daily. 02/09/21 12/19/30  Moore, Janece, FNP  promethazine -dextromethorphan (PROMETHAZINE -DM) 6.25-15 MG/5ML syrup Take 5 mLs by mouth at bedtime as needed for cough. 06/28/23   Karon Packer, NP    Family History Family History  Problem Relation Age of Onset   Hypertension Mother    Heart failure Mother    Heart disease  Neg Hx    Breast cancer Neg Hx     Social History Social History   Tobacco Use   Smoking status: Never   Smokeless tobacco: Never  Vaping Use   Vaping status: Never Used  Substance Use Topics   Alcohol use: No   Drug use: No     Allergies   Patient has no known allergies.   Review of Systems Review of Systems  Constitutional: Negative.   HENT: Negative.    Respiratory: Negative.    Cardiovascular: Negative.   Gastrointestinal: Negative.   Skin:  Positive for rash.     Physical Exam Triage Vital Signs ED Triage Vitals [09/04/23 1120]  Encounter Vitals Group     BP 120/77     Systolic BP Percentile      Diastolic BP Percentile      Pulse Rate 70     Resp 18     Temp 98.4 F (36.9 C)     Temp Source Oral     SpO2 95 %     Weight      Height      Head Circumference      Peak Flow      Pain Score      Pain Loc      Pain Education      Exclude from Growth Chart    No data found.  Updated Vital Signs BP 120/77 (BP Location: Left Arm)   Pulse 70   Temp 98.4 F (36.9 C) (Oral)   Resp 18   LMP 03/04/2015   SpO2 95%   Visual Acuity Right Eye Distance:   Left Eye Distance:   Bilateral Distance:    Right Eye Near:   Left Eye Near:    Bilateral Near:     Physical Exam Constitutional:      Appearance: Normal appearance. She is normal weight.  Skin:    Comments: She has discrete, raised, red wheels to her face, back, arms;    Neurological:     General: No focal deficit present.     Mental Status: She is alert.  Psychiatric:        Mood and Affect: Mood normal.      UC Treatments / Results  Labs (all labs ordered are listed, but only abnormal results are displayed) Labs Reviewed - No data to display  EKG   Radiology No results found.  Procedures Procedures (including critical care time)  Medications Ordered in UC Medications  diphenhydrAMINE  (BENADRYL ) capsule 25 mg (has no administration in time range)  methylPREDNISolone   sodium succinate (SOLU-MEDROL ) 125 mg/2 mL injection 80 mg (80 mg Intramuscular Given 09/04/23 1202)    Initial Impression / Assessment and Plan / UC Course  I have reviewed the triage vital signs and the nursing notes.  Pertinent labs & imaging results that were available during my care of the patient were reviewed by me and considered in my medical decision making (see chart for details).    Final Clinical Impressions(s) / UC Diagnoses   Final diagnoses:  Hives     Discharge Instructions      You were seen today for rash/hives.  We have given you a shot of a steroid while here today.  I have sent out a steroid pack.  Please don't start this until tomorrow.  You  may use over the counter zyrtec or benadryl  as well for the itch, as well as over the counter topical steroids as needed.  Follow up if not improving.     ED Prescriptions     Medication Sig Dispense Auth. Provider   methylPREDNISolone  (MEDROL  DOSEPAK) 4 MG TBPK tablet Take as directed 1 each Lesle Ras, MD      PDMP not reviewed this encounter.   Lesle Ras, MD 09/04/23 1137    Lesle Ras, MD 09/04/23 [phone]

## 2023-09-22 ENCOUNTER — Other Ambulatory Visit: Payer: Self-pay | Admitting: Nurse Practitioner

## 2023-09-22 DIAGNOSIS — R232 Flushing: Secondary | ICD-10-CM

## 2023-09-25 ENCOUNTER — Other Ambulatory Visit: Payer: Self-pay

## 2023-09-25 ENCOUNTER — Ambulatory Visit (HOSPITAL_COMMUNITY)
Admission: EM | Admit: 2023-09-25 | Discharge: 2023-09-25 | Disposition: A | Discharge status: Home / Self Care | Attending: Sports Medicine | Admitting: Sports Medicine

## 2023-09-25 ENCOUNTER — Encounter (HOSPITAL_COMMUNITY): Payer: Self-pay | Admitting: Emergency Medicine

## 2023-09-25 DIAGNOSIS — M79605 Pain in left leg: Secondary | ICD-10-CM
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
## 2023-09-25 NOTE — Discharge Instructions (Addendum)
 You exam was reassuring for the leg. I don't think you caused any major injury, just a contusion of the thigh muscles. Continue heating pad and ice/cold compression whichever you find most beneficial. You can also use tylenol and motrin  if you need something else for pain. Starts some gentle stretches of your quadriceps over the next week. Activity/walking as tolerated. If this is not improving over the next 3-4 weeks I'd recommend follow-up with sports medicine.  [facility] Address: [address] [place], [place], [place] [address] Phone: [phone]
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
## 2023-09-26 ENCOUNTER — Encounter: Payer: Self-pay | Admitting: Nurse Practitioner

## 2023-09-26 ENCOUNTER — Ambulatory Visit: Payer: Commercial Managed Care - PPO | Admitting: Nurse Practitioner

## 2023-09-26 VITALS — BP 140/80 | HR 71 | Temp 98.4°F | Ht 61.0 in | Wt 219.0 lb

## 2023-09-26 DIAGNOSIS — R232 Flushing: Secondary | ICD-10-CM | POA: Diagnosis not present

## 2023-09-26 DIAGNOSIS — E782 Mixed hyperlipidemia: Secondary | ICD-10-CM

## 2023-09-26 DIAGNOSIS — E1169 Type 2 diabetes mellitus with other specified complication: Secondary | ICD-10-CM | POA: Diagnosis not present

## 2023-09-26 DIAGNOSIS — Z6841 Body Mass Index (BMI) 40.0 and over, adult: Secondary | ICD-10-CM

## 2023-09-26 DIAGNOSIS — R052 Subacute cough: Secondary | ICD-10-CM

## 2023-09-26 DIAGNOSIS — Z2821 Immunization not carried out because of patient refusal: Secondary | ICD-10-CM

## 2023-09-26 DIAGNOSIS — Z8709 Personal history of other diseases of the respiratory system: Secondary | ICD-10-CM

## 2023-09-26 DIAGNOSIS — E669 Obesity, unspecified: Secondary | ICD-10-CM

## 2023-09-26 DIAGNOSIS — E66813 Obesity, class 3: Secondary | ICD-10-CM

## 2023-09-26 MED ORDER — MONTELUKAST SODIUM 10 MG PO TABS: 10.0000 mg | ORAL_TABLET | Freq: Every day | ORAL | 1 refills | Status: DC

## 2023-09-26 MED ORDER — PAROXETINE HCL 10 MG PO TABS: 10.0000 mg | ORAL_TABLET | Freq: Every day | ORAL | 1 refills | Status: DC

## 2023-09-26 MED ORDER — OMEPRAZOLE 20 MG PO CPDR: 20.0000 mg | DELAYED_RELEASE_CAPSULE | Freq: Every day | ORAL | 1 refills | Status: DC

## 2023-09-26 MED ORDER — SEMAGLUTIDE (1 MG/DOSE) 4 MG/3ML ~~LOC~~ SOPN: 1.0000 mg | PEN_INJECTOR | SUBCUTANEOUS | 1 refills | Status: DC

## 2023-09-26 MED ORDER — ATORVASTATIN CALCIUM 10 MG PO TABS: 10.0000 mg | ORAL_TABLET | Freq: Every day | ORAL | 2 refills | Status: DC

## 2023-09-26 MED ORDER — BUDESONIDE-FORMOTEROL FUMARATE 160-4.5 MCG/ACT IN AERO: 2.0000 | INHALATION_SPRAY | Freq: Every day | RESPIRATORY_TRACT | 6 refills | Status: DC

## 2023-09-26 NOTE — Assessment & Plan Note (Signed)
 Cholesterol levels have been stable.  Continue current
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
## 2023-09-27 LAB — LIPID PANEL
Chol/HDL Ratio: 4.4 ratio (ref 0.0–4.4)
Cholesterol, Total: 224 mg/dL — ABNORMAL HIGH (ref 100–199)
HDL: 51 mg/dL (ref >39)
LDL Chol Calc (NIH): 157 mg/dL — ABNORMAL HIGH (ref 0–99)
Triglycerides: 89 mg/dL (ref 0–149)
VLDL Cholesterol Cal: 16 mg/dL (ref 5–40)

## 2023-09-27 LAB — BMP8+EGFR
BUN/Creatinine Ratio: 19 (ref 12–28)
BUN: 14 mg/dL (ref 8–27)
CO2: 20 mmol/L (ref 20–29)
Calcium: 9.3 mg/dL (ref 8.7–10.3)
Chloride: 106 mmol/L (ref 96–106)
Creatinine, Ser: 0.74 mg/dL (ref 0.57–1.00)
Glucose: 87 mg/dL (ref 70–99)
Potassium: 4.4 mmol/L (ref 3.5–5.2)
Sodium: 143 mmol/L (ref 134–144)
eGFR: 93 mL/min/{1.73_m2} (ref >59)

## 2023-09-27 LAB — HEMOGLOBIN A1C
Est. average glucose Bld gHb Est-mCnc: 134 mg/dL
Hgb A1c MFr Bld: 6.3 % — ABNORMAL HIGH (ref 4.8–5.6)

## 2023-10-01 ENCOUNTER — Other Ambulatory Visit: Payer: Self-pay | Admitting: Podiatry

## 2023-10-06 ENCOUNTER — Ambulatory Visit: Payer: Self-pay | Admitting: Nurse Practitioner

## 2023-10-06 NOTE — Assessment & Plan Note (Signed)
 Slightly improved, will increase Ozempic  to 1 mg, tolerating well.

## 2023-10-06 NOTE — Assessment & Plan Note (Signed)

## 2023-10-06 NOTE — Assessment & Plan Note (Signed)
 Continue paxil  has been effective.

## 2023-10-06 NOTE — Assessment & Plan Note (Signed)
 Declines shingrix, educated on disease process and is aware if he changes his mind to notify office

## 2023-10-06 NOTE — Assessment & Plan Note (Signed)
 She is encouraged to strive for BMI less than 30 to decrease cardiac risk. Advised to aim for at least 150 minutes of exercise per week.

## 2023-12-29 ENCOUNTER — Emergency Department (HOSPITAL_COMMUNITY)
Admission: EM | Admit: 2023-12-29 | Discharge: 2023-12-29 | Discharge status: Against Medical Advice | Source: Home / Self Care

## 2023-12-29 ENCOUNTER — Ambulatory Visit (HOSPITAL_COMMUNITY)
Admission: EM | Admit: 2023-12-29 | Discharge: 2023-12-29 | Disposition: A | Discharge status: Home / Self Care | Attending: Emergency Medicine | Admitting: Emergency Medicine

## 2023-12-29 ENCOUNTER — Telehealth (HOSPITAL_COMMUNITY): Payer: Self-pay

## 2023-12-29 ENCOUNTER — Ambulatory Visit (INDEPENDENT_AMBULATORY_CARE_PROVIDER_SITE_OTHER)

## 2023-12-29 DIAGNOSIS — R109 Unspecified abdominal pain: Secondary | ICD-10-CM | POA: Insufficient documentation

## 2023-12-29 LAB — COMPREHENSIVE METABOLIC PANEL WITH GFR
ALT: 16 U/L (ref 0–44)
AST: 24 U/L (ref 15–41)
Albumin: 3.4 g/dL — ABNORMAL LOW (ref 3.5–5.0)
Alkaline Phosphatase: 61 U/L (ref 38–126)
Anion gap: 8 (ref 5–15)
BUN: 14 mg/dL (ref 6–20)
CO2: 25 mmol/L (ref 22–32)
Calcium: 9 mg/dL (ref 8.9–10.3)
Chloride: 105 mmol/L (ref 98–111)
Creatinine, Ser: 0.72 mg/dL (ref 0.44–1.00)
GFR, Estimated: >60 mL/min (ref >60)
Glucose, Bld: 75 mg/dL (ref 70–99)
Potassium: 4.3 mmol/L (ref 3.5–5.1)
Sodium: 138 mmol/L (ref 135–145)
Total Bilirubin: 0.5 mg/dL (ref 0.0–1.2)
Total Protein: 7.8 g/dL (ref 6.5–8.1)

## 2023-12-29 LAB — CBC WITH DIFFERENTIAL/PLATELET
Basophils Absolute: 0.1 K/uL (ref 0.0–0.1)
Basophils Relative: 1 %
Eosinophils Absolute: 0.1 K/uL (ref 0.0–0.5)
Eosinophils Relative: 2 %
HCT: 40.3 % (ref 36.0–46.0)
Hemoglobin: 12.6 g/dL (ref 12.0–15.0)
Lymphocytes Relative: 34 %
Lymphs Abs: 2.2 K/uL (ref 0.7–4.0)
MCH: 26.4 pg (ref 26.0–34.0)
MCHC: 31.3 g/dL (ref 30.0–36.0)
MCV: 84.3 fL (ref 80.0–100.0)
Monocytes Absolute: 0.4 K/uL (ref 0.1–1.0)
Monocytes Relative: 6 %
Neutro Abs: 3.7 K/uL (ref 1.7–7.7)
Neutrophils Relative %: 57 %
Platelets: 326 K/uL (ref 150–400)
RBC: 4.78 MIL/uL (ref 3.87–5.11)
RDW: 15.3 % (ref 11.5–15.5)
WBC: 6.5 K/uL (ref 4.0–10.5)
nRBC: 0 % (ref 0.0–0.2)

## 2023-12-29 LAB — LIPASE, BLOOD: Lipase: 54 U/L — ABNORMAL HIGH (ref 11–51)

## 2023-12-29 MED ORDER — ONDANSETRON 4 MG PO TBDP
4.0000 mg | ORAL_TABLET | Freq: Three times a day (TID) | ORAL | 0 refills | Status: AC | PRN
Start: 2023-12-29 — End: ?

## 2023-12-29 MED ORDER — ONDANSETRON 4 MG PO TBDP: 4.0000 mg | ORAL_TABLET | Freq: Three times a day (TID) | ORAL | 0 refills | Status: DC | PRN

## 2023-12-29 MED ORDER — ONDANSETRON 4 MG PO TBDP
ORAL_TABLET | ORAL | Status: AC
Start: 2023-12-29 — End: 2023-12-29
  Filled 2023-12-29: qty 1

## 2023-12-29 MED ORDER — ONDANSETRON 4 MG PO TBDP
4.0000 mg | ORAL_TABLET | Freq: Once | ORAL | Status: AC
  Administered 2023-12-29: 4 mg via ORAL

## 2023-12-29 NOTE — ED Provider Notes (Addendum)
 [facility]    CSN: [id number] Arrival date & time: 12/29/23  1113      History   Chief Complaint Chief Complaint  Patient presents with   Abdominal Pain    HPI Paula Sparks is a 58 y.o. female.   Patient presents to clinic over concern of abdominal pain, nausea and emesis that started this morning. She reports her abdominal pain was 100/10 initially and went to the ED where she vomited up the water she drank, started to feel a lot better so she came to urgent care. She would like to rule out a blockage in her intestines. Did have a normal bowel movement this morning.   Has been taking Ozempic  for a while, gave herself an injection last night.   Nausea continues.   The history is provided by the patient and medical records.  Abdominal Pain   Past Medical History:  Diagnosis Date   Cough    GERD (gastroesophageal reflux disease)    Hyperlipidemia    Murmur, heart     Patient Active Problem List   Diagnosis Date Noted   Herpes zoster vaccination declined 09/26/2023   Encounter for annual health examination 06/10/2023   COVID-19 vaccination declined 06/10/2023   Lower extremity edema 06/10/2023   Encounter for immunization 06/10/2023   Mixed hyperlipidemia 11/21/2022   Type 2 diabetes mellitus with obesity (HCC) 11/21/2022   Class 3 severe obesity due to excess calories with body mass index (BMI) of 40.0 to 44.9 in adult 11/21/2022   Hot flashes 11/21/2022   Morbid obesity (HCC) 02/25/2019   Tibialis posterior tendinitis 03/14/2015   Pes planus of both feet 03/14/2015   Pronation deformity of both feet 03/14/2015   Cardiac murmur 05/11/2011   Cough 04/03/2011    Past Surgical History:  Procedure Laterality Date   BREAST LUMPECTOMY  2003   CESAREAN SECTION     NASAL SINUS SURGERY      OB History   No obstetric history on file.      Home Medications    Prior to Admission medications   Medication Sig Start Date End Date Taking?  Authorizing Provider  ondansetron  (ZOFRAN -ODT) 4 MG disintegrating tablet Take 1 tablet (4 mg total) by mouth every 8 (eight) hours as needed for nausea or vomiting. 12/29/23  Yes Dreama, Jamelle Noy  N, FNP  aspirin EC 81 MG tablet Take 81 mg by mouth daily.    [provider]  atorvastatin  (LIPITOR) 10 MG tablet Take 1 tablet (10 mg total) by mouth daily. 09/26/23   Moore, Janece, FNP  budesonide -formoterol  (SYMBICORT ) 160-4.5 MCG/ACT inhaler INHALE 2 PUFFS INTO THE LUNGS DAILY. 09/26/23   Moore, Janece, FNP  cetirizine (ZYRTEC) 10 MG tablet Take 10 mg by mouth daily.    [provider]  fish oil-omega-3 fatty acids 1000 MG capsule Take 2 g by mouth daily.    [provider]  ibuprofen  (ADVIL ) 600 MG tablet Take 1 tablet (600 mg total) by mouth every 6 (six) hours as needed. 03/06/19   Corlis Burnard DEL, NP  meloxicam  (MOBIC ) 15 MG tablet TAKE 1 TABLET (15 MG TOTAL) BY MOUTH DAILY. 10/01/23   Janit Thresa CHRISTELLA, DPM  montelukast  (SINGULAIR ) 10 MG tablet Take 1 tablet (10 mg total) by mouth daily. 09/26/23   Moore, Janece, FNP  Multiple Vitamins-Minerals (MULTIVITAMIN WITH MINERALS) tablet Take 1 tablet by mouth daily.    [provider]  omeprazole  (PRILOSEC ) 20 MG capsule Take 1 capsule (20 mg total) by  mouth daily. 09/26/23   Moore, Janece, FNP  PARoxetine  (PAXIL ) 10 MG tablet Take 1 tablet (10 mg total) by mouth daily. 09/26/23   Georgina Speaks, FNP  Semaglutide , 1 MG/DOSE, 4 MG/3ML SOPN Inject 1 mg into the skin once a week. 09/26/23   Georgina Speaks, FNP    Family History Family History  Problem Relation Age of Onset   Hypertension Mother    Heart failure Mother    Heart disease Neg Hx    Breast cancer Neg Hx     Social History Social History   Tobacco Use   Smoking status: Never   Smokeless tobacco: Never  Vaping Use   Vaping status: Never Used  Substance Use Topics   Alcohol use: No   Drug use: No     Allergies   Patient has no known  allergies.   Review of Systems Review of Systems  Per HPI  Physical Exam Triage Vital Signs ED Triage Vitals [12/29/23 1213]  Encounter Vitals Group     BP 122/66     Girls Systolic BP Percentile      Girls Diastolic BP Percentile      Boys Systolic BP Percentile      Boys Diastolic BP Percentile      Pulse Rate 64     Resp 18     Temp 98.2 F (36.8 C)     Temp src      SpO2 100 %     Weight      Height      Head Circumference      Peak Flow      Pain Score      Pain Loc      Pain Education      Exclude from Growth Chart    No data found.  Updated Vital Signs BP 122/66   Pulse 64   Temp 98.2 F (36.8 C)   Resp 18   LMP 03/04/2015   SpO2 100%   Visual Acuity Right Eye Distance:   Left Eye Distance:   Bilateral Distance:    Right Eye Near:   Left Eye Near:    Bilateral Near:     Physical Exam Vitals and nursing note reviewed.  Constitutional:      Appearance: She is well-developed.  HENT:     Head: Normocephalic and atraumatic.     Mouth/Throat:     Mouth: Mucous membranes are moist.  Eyes:     General: No scleral icterus. Cardiovascular:     Rate and Rhythm: Normal rate.  Pulmonary:     Effort: Pulmonary effort is normal. No respiratory distress.  Abdominal:     General: Abdomen is flat. Bowel sounds are normal.     Palpations: Abdomen is soft.     Tenderness: There is abdominal tenderness in the epigastric area. There is no guarding or rebound.  Skin:    General: Skin is warm and dry.  Neurological:     General: No focal deficit present.     Mental Status: She is alert and oriented to person, place, and time.  Psychiatric:        Mood and Affect: Mood normal.        Behavior: Behavior normal.      UC Treatments / Results  Labs (all labs ordered are listed, but only abnormal results are displayed) Labs Reviewed  CBC WITH DIFFERENTIAL/PLATELET  COMPREHENSIVE METABOLIC PANEL WITH GFR  LIPASE, BLOOD    EKG   Radiology DG  Abd 2  Views Result Date: 12/29/2023 CLINICAL DATA:  Abdominal pain, vomiting EXAM: ABDOMEN - 2 VIEW COMPARISON:  None Available. FINDINGS: The bowel gas pattern is normal. There is no evidence of free air. No radio-opaque calculi or other significant radiographic abnormality is seen. IMPRESSION: No acute findings. Electronically Signed   By: Michaeline Blanch M.D.   On: 12/29/2023 14:13    Procedures Procedures (including critical care time)  Medications Ordered in UC Medications  ondansetron  (ZOFRAN -ODT) disintegrating tablet 4 mg (4 mg Oral Given 12/29/23 1313)    Initial Impression / Assessment and Plan / UC Course  I have reviewed the triage vital signs and the nursing notes.  Pertinent labs & imaging results that were available during my care of the patient were reviewed by me and considered in my medical decision making (see chart for details).  Vitals and triage reviewed, patient is hemodynamically stable.  Abdomen is soft with active bowel sounds, mild epigastric tenderness.  Without rebound or guarding.  Low concern for acute abdomen at this time.  Discussed less likely for bowel obstruction due to active bowel sounds and recent normal bowel movement as of this morning.  Abdominal x-ray by my interpretation does not show obstruction, over-read confirms this.  ODT Zofran  given, nausea improved. Labs drawn. Will contact if results require urgent follow-up, suspect N/V abd pain d/t Ozempic .   POC, f/u care and return precautions given, no questions at this time.     Final Clinical Impressions(s) / UC Diagnoses   Final diagnoses:  Abdominal pain, unspecified abdominal location     Discharge Instructions      Your x-ray read is pending, we have checked some basic lab work and we will contact you if urgent follow-up is needed.  Use the nausea medicine every 8 hours as needed.  Follow-up with your primary care provider if this persists.    Seek immediate care at the nearest emergency  department if you develop fever, vomiting of fecal matter, or severe abdominal pain.     ED Prescriptions     Medication Sig Dispense Auth. Provider   ondansetron  (ZOFRAN -ODT) 4 MG disintegrating tablet Take 1 tablet (4 mg total) by mouth every 8 (eight) hours as needed for nausea or vomiting. 20 tablet Dreama, Shalinda Burkholder  N, FNP      PDMP not reviewed this encounter.       Dreama, Antanasia Kaczynski  N, FNP 12/29/23 1430

## 2023-12-29 NOTE — ED Triage Notes (Signed)
 PT has just started ozempic  . PT reports dose was increased 2 months ago . This morning Pt had ABD pain and vomited . Pt started Ozempic  in January and dose has been increased several times.

## 2023-12-29 NOTE — Discharge Instructions (Addendum)
 Your x-ray read is pending, we have checked some basic lab work and we will contact you if urgent follow-up is needed.  Use the nausea medicine every 8 hours as needed.  Follow-up with your primary care provider if this persists.    Seek immediate care at the nearest emergency department if you develop fever, vomiting of fecal matter, or severe abdominal pain.

## 2023-12-29 NOTE — Telephone Encounter (Signed)
 Pharmacy changed per pt request

## 2023-12-31 ENCOUNTER — Ambulatory Visit (HOSPITAL_COMMUNITY): Payer: Self-pay

## 2024-01-28 ENCOUNTER — Ambulatory Visit: Admitting: Nurse Practitioner

## 2024-04-16 ENCOUNTER — Other Ambulatory Visit: Payer: Self-pay | Admitting: Nurse Practitioner

## 2024-04-16 DIAGNOSIS — E669 Obesity, unspecified: Secondary | ICD-10-CM

## 2024-05-27 NOTE — Progress Notes (Signed)
 LILLETTE Kristeen JINNY Gladis, CMA,acting as a neurosurgeon for [name], FNP.,have documented all relevant documentation on the behalf of [name], FNP,as directed by  [name], FNP while in the presence of [name], FNP.  Subjective:    Patient ID: Paula Sparks , female    DOB: [birth year] , 59 y.o.   MRN: [id number]  Chief Complaint  Patient presents with   Annual Exam    Patient presents today for HM, Patient reports compliance with medication. Patient denies any chest pain, SOB, or headaches. Patient has no concerns today.      HPI  Discussed the use of AI scribe software for clinical note transcription with the patient, who gave verbal consent to proceed.  History of Present Illness Paula Sparks is a 59 year old female who presents for an annual physical exam.  Recently, she traveled to [place] to celebrate her brother's 70th birthday with family during the holiday season.  Past Medical History:  Diagnosis Date   Cough    GERD (gastroesophageal reflux disease)    Hyperlipidemia    Murmur, heart      Family History  Problem Relation Age of Onset   Hypertension Mother    Heart failure Mother    Heart disease Neg Hx    Breast cancer Neg Hx     Current Medications[1]   Allergies[2]    The patient states she uses post menopausal status for birth control. Patient's last menstrual period was 03/04/2015.  Negative for Dysmenorrhea and Negative for Menorrhagia. Negative for: breast discharge, breast lump(s), breast pain and breast self exam. Associated symptoms include abnormal vaginal bleeding. Pertinent negatives include abnormal bleeding (hematology), anxiety, decreased libido, depression, difficulty falling sleep, dyspareunia, history of infertility, nocturia, sexual dysfunction, sleep disturbances, urinary incontinence, urinary urgency, vaginal discharge and vaginal itching. Diet regular; she is eating smaller portions. The patient states her exercise  level is minimal 2-3 days.   The patient's tobacco use is: Tobacco Use History[3]. She has been exposed to passive smoke. The patient's alcohol use is:  Social History   Substance and Sexual Activity  Alcohol Use No  Additional information: Last pap 05/11/2021, next one scheduled for this year.    Review of Systems  Constitutional: Negative.   HENT: Negative.    Eyes: Negative.   Respiratory: Negative.    Cardiovascular:  Negative for chest pain, palpitations and leg swelling.  Gastrointestinal: Negative.   Endocrine: Negative.   Genitourinary: Negative.   Musculoskeletal: Negative.   Skin: Negative.   Allergic/Immunologic: Negative.   Neurological: Negative.  Negative for headaches.  Hematological: Negative.   Psychiatric/Behavioral: Negative.       Today's Vitals   05/28/24 1017  BP: 130/64  Pulse: 60  Temp: 98.6 F (37 C)  TempSrc: Oral  Weight: 190 lb 3.2 oz (86.3 kg)  Height: 5' 1 (1.549 m)  PainSc: 3   PainLoc: Foot   Body mass index is 35.94 kg/m.  Wt Readings from Last 3 Encounters:  05/28/24 190 lb 3.2 oz (86.3 kg)  09/26/23 219 lb (99.3 kg)  06/28/23 224 lb 6.9 oz (101.8 kg)     Objective:  Physical Exam Vitals and nursing note reviewed.  Constitutional:      General: She is not in acute distress.    Appearance: Normal appearance. She is well-developed. She is obese.  HENT:     Head: Normocephalic and atraumatic.     Right Ear: Hearing, tympanic membrane, ear canal and external ear normal. There  is no impacted cerumen.     Left Ear: Hearing, tympanic membrane, ear canal and external ear normal. There is no impacted cerumen.     Nose: Nose normal.     Mouth/Throat:     Mouth: Mucous membranes are moist.  Eyes:     General: Lids are normal.     Extraocular Movements: Extraocular movements intact.     Conjunctiva/sclera: Conjunctivae normal.     Pupils: Pupils are equal, round, and reactive to light.     Funduscopic exam:    Right eye: No  papilledema.        Left eye: No papilledema.  Neck:     Thyroid : No thyroid  mass.     Vascular: No carotid bruit.  Cardiovascular:     Rate and Rhythm: Normal rate and regular rhythm.     Pulses: Normal pulses.     Heart sounds: Normal heart sounds. No murmur heard. Pulmonary:     Effort: Pulmonary effort is normal. No respiratory distress.     Breath sounds: Normal breath sounds. No wheezing.  Chest:     Chest wall: No mass.  Breasts:    Tanner Score is 5.     Right: Normal. No mass or tenderness.     Left: Normal. No mass or tenderness.  Abdominal:     General: Abdomen is flat. Bowel sounds are normal. There is no distension.     Palpations: Abdomen is soft.     Tenderness: There is no abdominal tenderness.  Genitourinary:    Rectum: Guaiac result negative.  Musculoskeletal:        General: No swelling. Normal range of motion.     Cervical back: Full passive range of motion without pain, normal range of motion and neck supple.     Right lower leg: No edema.     Left lower leg: No edema.  Lymphadenopathy:     Upper Body:     Right upper body: No supraclavicular, axillary or pectoral adenopathy.     Left upper body: No supraclavicular, axillary or pectoral adenopathy.  Skin:    General: Skin is warm and dry.     Capillary Refill: Capillary refill takes less than 2 seconds.  Neurological:     General: No focal deficit present.     Mental Status: She is alert and oriented to person, place, and time.     Cranial Nerves: No cranial nerve deficit.     Sensory: No sensory deficit.  Psychiatric:        Mood and Affect: Mood normal.        Behavior: Behavior normal.        Thought Content: Thought content normal.        Judgment: Judgment normal.         Assessment And Plan:     Encounter for annual health examination Assessment & Plan: Behavior modifications discussed and diet history reviewed.   Pt will continue to exercise regularly and modify diet with low GI,  plant based foods and decrease intake of processed foods.  Recommend intake of daily multivitamin, Vitamin D , and calcium .  Recommend mammogram and colonoscopy for preventive screenings, as well as recommend immunizations that include influenza, TDAP    Type 2 diabetes mellitus in patient with obesity (HCC) Assessment & Plan: Slightly improved, will increase Ozempic  to 1 mg, tolerating well.   Orders: -     EKG 12-Lead -     Microalbumin / creatinine urine ratio -  Urinalysis, Complete -     CMP14+EGFR -     Hemoglobin A1c -     blood glucose meter kit and supplies; Dispense based on patient and insurance preference. Use up to four times daily as directed.  Dispense: 1 each; Refill: 0  Mixed hyperlipidemia Assessment & Plan: Cholesterol levels have been stable.  Continue current   Orders: -     Lipid panel -     Atorvastatin  Calcium ; Take 1 tablet (10 mg total) by mouth daily.  Dispense: 90 tablet; Refill: 1  Hot flashes Assessment & Plan: Continue paxil  has been effective. She had used veozah  in the past but her insurance did not cover. We can consider hormone treatment if the Paxil  is ineffective.   Orders: -     PARoxetine  HCl; Take 1 tablet (10 mg total) by mouth daily.  Dispense: 90 tablet; Refill: 1  Class 2 obesity due to excess calories with body mass index (BMI) of 35.0 to 35.9 in adult, unspecified whether serious comorbidity present  Other long term (current) drug therapy -     CBC with Differential/Platelet  History of asthma -     Budesonide -Formoterol  Fumarate; INHALE 2 PUFFS INTO THE LUNGS DAILY.  Dispense: 10.2 g; Refill: 6 -     Montelukast  Sodium; Take 1 tablet (10 mg total) by mouth daily.  Dispense: 90 tablet; Refill: 1  Other orders -     Omeprazole ; Take 1 capsule (20 mg total) by mouth daily.  Dispense: 90 capsule; Refill: 1 -     Microscopic Examination      Return for 1 year physical, controlled DM check 4 months. Patient was given  opportunity to ask questions. Patient verbalized understanding of the plan and was able to repeat key elements of the plan. All questions were answered to their satisfaction.   [name], FNP  I, [name], FNP, have reviewed all documentation for this visit. The documentation on 05/28/24 for the exam, diagnosis, procedures, and orders are all accurate and complete.      [1]  Current Outpatient Medications:    aspirin EC 81 MG tablet, Take 81 mg by mouth daily., Disp: , Rfl:    blood glucose meter kit and supplies KIT, Dispense based on patient and insurance preference. Use up to four times daily as directed., Disp: 1 each, Rfl: 0   cetirizine (ZYRTEC) 10 MG tablet, Take 10 mg by mouth daily., Disp: , Rfl:    fish oil-omega-3 fatty acids 1000 MG capsule, Take 2 g by mouth daily., Disp: , Rfl:    ibuprofen  (ADVIL ) 600 MG tablet, Take 1 tablet (600 mg total) by mouth every 6 (six) hours as needed., Disp: 30 tablet, Rfl: 0   meloxicam  (MOBIC ) 15 MG tablet, TAKE 1 TABLET (15 MG TOTAL) BY MOUTH DAILY., Disp: 30 tablet, Rfl: 2   Multiple Vitamins-Minerals (MULTIVITAMIN WITH MINERALS) tablet, Take 1 tablet by mouth daily., Disp: , Rfl:    ondansetron  (ZOFRAN -ODT) 4 MG disintegrating tablet, Take 1 tablet (4 mg total) by mouth every 8 (eight) hours as needed for nausea or vomiting., Disp: 20 tablet, Rfl: 0   Semaglutide , 1 MG/DOSE, (OZEMPIC , 1 MG/DOSE,) 4 MG/3ML SOPN, INJECT 1MG  INTO THE SKIN ONCE A WEEK, Disp: 9 mL, Rfl: 1   atorvastatin  (LIPITOR) 10 MG tablet, Take 1 tablet (10 mg total) by mouth daily., Disp: 90 tablet, Rfl: 1   budesonide -formoterol  (SYMBICORT ) 160-4.5 MCG/ACT inhaler, INHALE 2 PUFFS INTO THE LUNGS DAILY., Disp: 10.2 g, Rfl: 6  montelukast  (SINGULAIR ) 10 MG tablet, Take 1 tablet (10 mg total) by mouth daily., Disp: 90 tablet, Rfl: 1   omeprazole  (PRILOSEC ) 20 MG capsule, Take 1 capsule (20 mg total) by mouth daily., Disp: 90 capsule, Rfl: 1   PARoxetine  (PAXIL ) 10 MG  tablet, Take 1 tablet (10 mg total) by mouth daily., Disp: 90 tablet, Rfl: 1 [2] No Known Allergies [3]  Social History Tobacco Use  Smoking Status Never  Smokeless Tobacco Never

## 2024-05-28 ENCOUNTER — Encounter: Payer: Self-pay | Admitting: Nurse Practitioner

## 2024-05-28 ENCOUNTER — Ambulatory Visit: Payer: Self-pay | Admitting: Nurse Practitioner

## 2024-05-28 VITALS — BP 130/64 | HR 60 | Temp 98.6°F | Ht 61.0 in | Wt 190.2 lb

## 2024-05-28 DIAGNOSIS — E66812 Obesity, class 2: Secondary | ICD-10-CM

## 2024-05-28 DIAGNOSIS — Z6835 Body mass index (BMI) 35.0-35.9, adult: Secondary | ICD-10-CM | POA: Diagnosis not present

## 2024-05-28 DIAGNOSIS — E1169 Type 2 diabetes mellitus with other specified complication: Secondary | ICD-10-CM | POA: Diagnosis not present

## 2024-05-28 DIAGNOSIS — E782 Mixed hyperlipidemia: Secondary | ICD-10-CM | POA: Diagnosis not present

## 2024-05-28 DIAGNOSIS — E6609 Other obesity due to excess calories: Secondary | ICD-10-CM

## 2024-05-28 DIAGNOSIS — R232 Flushing: Secondary | ICD-10-CM

## 2024-05-28 DIAGNOSIS — Z79899 Other long term (current) drug therapy: Secondary | ICD-10-CM | POA: Diagnosis not present

## 2024-05-28 DIAGNOSIS — Z0000 Encounter for general adult medical examination without abnormal findings: Secondary | ICD-10-CM | POA: Diagnosis not present

## 2024-05-28 DIAGNOSIS — Z8709 Personal history of other diseases of the respiratory system: Secondary | ICD-10-CM

## 2024-05-28 DIAGNOSIS — E669 Obesity, unspecified: Secondary | ICD-10-CM

## 2024-05-28 MED ORDER — BUDESONIDE-FORMOTEROL FUMARATE 160-4.5 MCG/ACT IN AERO: 2.0000 | INHALATION_SPRAY | Freq: Every day | RESPIRATORY_TRACT | 6 refills | Status: AC

## 2024-05-28 MED ORDER — PAROXETINE HCL 10 MG PO TABS: 10.0000 mg | ORAL_TABLET | Freq: Every day | ORAL | 1 refills | Status: AC

## 2024-05-28 MED ORDER — ATORVASTATIN CALCIUM 10 MG PO TABS: 10.0000 mg | ORAL_TABLET | Freq: Every day | ORAL | 1 refills | Status: AC

## 2024-05-28 MED ORDER — OMEPRAZOLE 20 MG PO CPDR: 20.0000 mg | DELAYED_RELEASE_CAPSULE | Freq: Every day | ORAL | 1 refills | Status: AC

## 2024-05-28 MED ORDER — MONTELUKAST SODIUM 10 MG PO TABS: 10.0000 mg | ORAL_TABLET | Freq: Every day | ORAL | 1 refills | Status: AC

## 2024-05-28 MED ORDER — BLOOD GLUCOSE MONITOR KIT: PACK | 0 refills | Status: AC

## 2024-05-29 LAB — CBC WITH DIFFERENTIAL/PLATELET
Basophils Absolute: 0.1 x10E3/uL (ref 0.0–0.2)
Basos: 1 %
EOS (ABSOLUTE): 0.1 x10E3/uL (ref 0.0–0.4)
Eos: 1 %
Hematocrit: 39.5 % (ref 34.0–46.6)
Hemoglobin: 12.7 g/dL (ref 11.1–15.9)
Immature Grans (Abs): 0 x10E3/uL (ref 0.0–0.1)
Immature Granulocytes: 0 %
Lymphocytes Absolute: 2.1 x10E3/uL (ref 0.7–3.1)
Lymphs: 40 %
MCH: 26.9 pg (ref 26.6–33.0)
MCHC: 32.2 g/dL (ref 31.5–35.7)
MCV: 84 fL (ref 79–97)
Monocytes Absolute: 0.5 x10E3/uL (ref 0.1–0.9)
Monocytes: 9 %
Neutrophils Absolute: 2.5 x10E3/uL (ref 1.4–7.0)
Neutrophils: 48 %
Platelets: 357 x10E3/uL (ref 150–450)
RBC: 4.72 x10E6/uL (ref 3.77–5.28)
RDW: 14.2 % (ref 11.7–15.4)
WBC: 5.2 x10E3/uL (ref 3.4–10.8)

## 2024-05-29 LAB — CMP14+EGFR
ALT: 12 IU/L (ref 0–32)
AST: 20 IU/L (ref 0–40)
Albumin: 3.9 g/dL (ref 3.8–4.9)
Alkaline Phosphatase: 72 IU/L (ref 49–135)
BUN/Creatinine Ratio: 27 (ref 12–28)
BUN: 21 mg/dL (ref 8–27)
Bilirubin Total: 0.4 mg/dL (ref 0.0–1.2)
CO2: 24 mmol/L (ref 20–29)
Calcium: 9.5 mg/dL (ref 8.7–10.3)
Chloride: 104 mmol/L (ref 96–106)
Creatinine, Ser: 0.78 mg/dL (ref 0.57–1.00)
Globulin, Total: 3.6 g/dL (ref 1.5–4.5)
Glucose: 81 mg/dL (ref 70–99)
Potassium: 4.9 mmol/L (ref 3.5–5.2)
Sodium: 142 mmol/L (ref 134–144)
Total Protein: 7.5 g/dL (ref 6.0–8.5)
eGFR: 87 mL/min/1.73

## 2024-05-29 LAB — URINALYSIS, COMPLETE
Bilirubin, UA: NEGATIVE
Glucose, UA: NEGATIVE
Ketones, UA: NEGATIVE
Leukocytes,UA: NEGATIVE
Nitrite, UA: NEGATIVE
Protein,UA: NEGATIVE
RBC, UA: NEGATIVE
Specific Gravity, UA: 1.028 (ref 1.005–1.030)
Urobilinogen, Ur: 0.2 mg/dL (ref 0.2–1.0)
pH, UA: 6.5 (ref 5.0–7.5)

## 2024-05-29 LAB — MICROSCOPIC EXAMINATION
Bacteria, UA: NONE SEEN
Casts: NONE SEEN /LPF

## 2024-05-29 LAB — LIPID PANEL
Chol/HDL Ratio: 4.1 ratio (ref 0.0–4.4)
Cholesterol, Total: 233 mg/dL — ABNORMAL HIGH (ref 100–199)
HDL: 57 mg/dL
LDL Chol Calc (NIH): 166 mg/dL — ABNORMAL HIGH (ref 0–99)
Triglycerides: 58 mg/dL (ref 0–149)
VLDL Cholesterol Cal: 10 mg/dL (ref 5–40)

## 2024-05-29 LAB — HEMOGLOBIN A1C
Est. average glucose Bld gHb Est-mCnc: 117 mg/dL
Hgb A1c MFr Bld: 5.7 % — ABNORMAL HIGH (ref 4.8–5.6)

## 2024-05-29 LAB — MICROALBUMIN / CREATININE URINE RATIO
Creatinine, Urine: 128 mg/dL
Microalb/Creat Ratio: 4 mg/g{creat} (ref 0–29)
Microalbumin, Urine: 5.5 ug/mL

## 2024-06-07 ENCOUNTER — Ambulatory Visit: Payer: Self-pay | Admitting: Nurse Practitioner

## 2024-06-07 NOTE — Assessment & Plan Note (Signed)
 Cholesterol levels have been stable.  Continue current

## 2024-06-07 NOTE — Assessment & Plan Note (Signed)

## 2024-06-07 NOTE — Assessment & Plan Note (Signed)
 Continue paxil  has been effective. She had used veozah  in the past but her insurance did not cover. We can consider hormone treatment if the Paxil  is ineffective.

## 2024-06-07 NOTE — Assessment & Plan Note (Addendum)
 Slightly improved, will increase Ozempic  to 1 mg, tolerating well. EKG done no changes from previous.

## 2024-06-10 ENCOUNTER — Other Ambulatory Visit: Payer: Self-pay | Admitting: Podiatry

## 2024-09-28 ENCOUNTER — Ambulatory Visit: Payer: Self-pay | Admitting: Nurse Practitioner

## 2025-06-02 ENCOUNTER — Encounter: Payer: Self-pay | Admitting: Nurse Practitioner
